# Patient Record
Sex: Male | Born: 1985 | Race: White | Hispanic: No | Marital: Single | Smoking: Never smoker
Health system: Southern US, Community
[De-identification: ages and names within clinical notes are randomized; demographics above are authoritative.]

## PROBLEM LIST (undated history)

## (undated) DIAGNOSIS — Z8489 Family history of other specified conditions: Secondary | ICD-10-CM

## (undated) DIAGNOSIS — I10 Essential (primary) hypertension: Secondary | ICD-10-CM

## (undated) DIAGNOSIS — F32A Depression, unspecified: Secondary | ICD-10-CM

## (undated) DIAGNOSIS — Z87442 Personal history of urinary calculi: Secondary | ICD-10-CM

## (undated) HISTORY — PX: KNEE SURGERY: SHX244

---

## 2019-11-04 ENCOUNTER — Emergency Department
Admission: EM | Admit: 2019-11-04 | Discharge: 2019-11-04 | Disposition: A | Discharge status: Home / Self Care | Payer: Managed Care, Other (non HMO) | Attending: Emergency Medicine | Admitting: Emergency Medicine

## 2019-11-04 ENCOUNTER — Emergency Department: Payer: Managed Care, Other (non HMO)

## 2019-11-04 ENCOUNTER — Other Ambulatory Visit: Payer: Self-pay

## 2019-11-04 DIAGNOSIS — N132 Hydronephrosis with renal and ureteral calculous obstruction: Secondary | ICD-10-CM | POA: Diagnosis not present

## 2019-11-04 DIAGNOSIS — R109 Unspecified abdominal pain: Secondary | ICD-10-CM | POA: Diagnosis present

## 2019-11-04 DIAGNOSIS — I10 Essential (primary) hypertension: Secondary | ICD-10-CM | POA: Diagnosis not present

## 2019-11-04 DIAGNOSIS — N200 Calculus of kidney: Secondary | ICD-10-CM

## 2019-11-04 HISTORY — DX: Essential (primary) hypertension: I10

## 2019-11-04 LAB — CBC
HCT: 39.8 % (ref 39.0–52.0)
Hemoglobin: 14.4 g/dL (ref 13.0–17.0)
MCH: 31.8 pg (ref 26.0–34.0)
MCHC: 36.2 g/dL — ABNORMAL HIGH (ref 30.0–36.0)
MCV: 87.9 fL (ref 80.0–100.0)
Platelets: 306 10*3/uL (ref 150–400)
RBC: 4.53 MIL/uL (ref 4.22–5.81)
RDW: 11.6 % (ref 11.5–15.5)
WBC: 15.7 10*3/uL — ABNORMAL HIGH (ref 4.0–10.5)
nRBC: 0 % (ref 0.0–0.2)

## 2019-11-04 LAB — URINALYSIS, COMPLETE (UACMP) WITH MICROSCOPIC
Bacteria, UA: NONE SEEN
Bilirubin Urine: NEGATIVE
Glucose, UA: NEGATIVE mg/dL
Ketones, ur: 5 mg/dL — AB
Leukocytes,Ua: NEGATIVE
Nitrite: NEGATIVE
Protein, ur: 100 mg/dL — AB
RBC / HPF: >50 RBC/hpf — ABNORMAL HIGH (ref 0–5)
Specific Gravity, Urine: 1.025 (ref 1.005–1.030)
pH: 6 (ref 5.0–8.0)

## 2019-11-04 LAB — COMPREHENSIVE METABOLIC PANEL
ALT: 45 U/L — ABNORMAL HIGH (ref 0–44)
AST: 33 U/L (ref 15–41)
Albumin: 5 g/dL (ref 3.5–5.0)
Alkaline Phosphatase: 53 U/L (ref 38–126)
Anion gap: 14 (ref 5–15)
BUN: 25 mg/dL — ABNORMAL HIGH (ref 6–20)
CO2: 21 mmol/L — ABNORMAL LOW (ref 22–32)
Calcium: 9.6 mg/dL (ref 8.9–10.3)
Chloride: 102 mmol/L (ref 98–111)
Creatinine, Ser: 1.52 mg/dL — ABNORMAL HIGH (ref 0.61–1.24)
GFR calc Af Amer: >60 mL/min (ref >60)
GFR calc non Af Amer: 58 mL/min — ABNORMAL LOW (ref >60)
Glucose, Bld: 135 mg/dL — ABNORMAL HIGH (ref 70–99)
Potassium: 4.3 mmol/L (ref 3.5–5.1)
Sodium: 137 mmol/L (ref 135–145)
Total Bilirubin: 0.8 mg/dL (ref 0.3–1.2)
Total Protein: 8.3 g/dL — ABNORMAL HIGH (ref 6.5–8.1)

## 2019-11-04 LAB — LIPASE, BLOOD: Lipase: 42 U/L (ref 11–51)

## 2019-11-04 MED ORDER — KETOROLAC TROMETHAMINE 30 MG/ML IJ SOLN
INTRAMUSCULAR | Status: AC
  Administered 2019-11-04: 30 mg
  Filled 2019-11-04: qty 1

## 2019-11-04 MED ORDER — KETOROLAC TROMETHAMINE 30 MG/ML IJ SOLN
15.0000 mg | INTRAMUSCULAR | Status: AC
  Administered 2019-11-04: 15 mg via INTRAVENOUS
  Filled 2019-11-04: qty 1

## 2019-11-04 MED ORDER — OXYCODONE-ACETAMINOPHEN 5-325 MG PO TABS: 1.0000 | ORAL_TABLET | Freq: Four times a day (QID) | ORAL | 0 refills | Status: DC | PRN

## 2019-11-04 MED ORDER — MORPHINE SULFATE (PF) 4 MG/ML IV SOLN: 4.0000 mg | Freq: Once | INTRAVENOUS | Status: DC

## 2019-11-04 MED ORDER — TAMSULOSIN HCL 0.4 MG PO CAPS: 0.4000 mg | ORAL_CAPSULE | Freq: Every day | ORAL | 0 refills | Status: DC

## 2019-11-04 MED ORDER — ONDANSETRON HCL 4 MG/2ML IJ SOLN: 4.0000 mg | Freq: Once | INTRAMUSCULAR | Status: AC

## 2019-11-04 MED ORDER — ONDANSETRON HCL 4 MG/2ML IJ SOLN
INTRAMUSCULAR | Status: AC
  Administered 2019-11-04: 4 mg via INTRAVENOUS
  Filled 2019-11-04: qty 2

## 2019-11-04 MED ORDER — ONDANSETRON 4 MG PO TBDP: 4.0000 mg | ORAL_TABLET | Freq: Three times a day (TID) | ORAL | 0 refills | Status: DC | PRN

## 2019-11-04 MED ORDER — CEPHALEXIN 500 MG PO CAPS: 500.0000 mg | ORAL_CAPSULE | Freq: Two times a day (BID) | ORAL | 0 refills | Status: AC

## 2019-11-04 MED ORDER — NAPROXEN 500 MG PO TABS: 500.0000 mg | ORAL_TABLET | Freq: Two times a day (BID) | ORAL | 0 refills | Status: DC

## 2019-11-04 NOTE — ED Triage Notes (Signed)
Pt to the er for severe right flank and abd pain. Pt is diaphoretic and vomiting.

## 2019-11-04 NOTE — ED Provider Notes (Signed)
[facility] Emergency Department Provider Note ____________________________________________   First MD Initiated Contact with Patient 11/04/19 1712     (approximate)  I have reviewed the triage vital signs and the nursing notes.   HISTORY  Chief Complaint Flank Pain  HPI Brandon Wyatt is a 34 y.o. male with history of hypertension presents to the emergency department for treatment and evaluation of right flank and right lower quadrant pain that started suddenly this morning.  He has also had vomiting and diaphoresis associated with the pain.  No previous symptoms similar to this.  No history of kidney stone.  No alleviating measures attempted prior to arrival..         Past Medical History:  Diagnosis Date  . Hypertension     There are no problems to display for this patient.   History reviewed. No pertinent surgical history.  Prior to Admission medications   Medication Sig Start Date End Date Taking? Authorizing Provider  cephALEXin (KEFLEX) 500 MG capsule Take 1 capsule (500 mg total) by mouth 2 (two) times daily for 7 days. 11/04/19 11/11/19  Cherye Gaertner, Rulon Eisenmenger B, FNP  naproxen (NAPROSYN) 500 MG tablet Take 1 tablet (500 mg total) by mouth 2 (two) times daily with a meal. 11/04/19   Kobe Ofallon B, FNP  ondansetron (ZOFRAN-ODT) 4 MG disintegrating tablet Take 1 tablet (4 mg total) by mouth every 8 (eight) hours as needed for nausea or vomiting. 11/04/19   Sharron Simpson B, FNP  oxyCODONE-acetaminophen (PERCOCET) 5-325 MG tablet Take 1 tablet by mouth every 6 (six) hours as needed. 11/04/19 11/03/20  Kaysia Willard, Rulon Eisenmenger B, FNP  tamsulosin (FLOMAX) 0.4 MG CAPS capsule Take 1 capsule (0.4 mg total) by mouth daily. 11/04/19   Chinita Pester, FNP    Allergies Patient has no allergy information on record.  No family history on file.  Social History Social History   Tobacco Use  . Smoking status: Never Smoker  . Smokeless tobacco: Never Used  Substance  Use Topics  . Alcohol use: Yes  . Drug use: Yes    Types: Marijuana    Review of Systems  Constitutional: No fever/chills Eyes: No visual changes. ENT: No sore throat. Cardiovascular: Denies chest pain. Respiratory: Denies shortness of breath. Gastrointestinal: Positive for abdominal pain. Genitourinary: Negative for dysuria.  Negative for obvious hematuria. Musculoskeletal: Positive for right-sided back pain.   Skin: Negative for rash. Neurological: Negative for headaches, focal weakness or numbness.  ____________________________________________   PHYSICAL EXAM:  VITAL SIGNS: ED Triage Vitals [11/04/19 1446]  Enc Vitals Group     BP (!) 212/98     Pulse Rate 94     Resp 20     Temp 97.7 F (36.5 C)     Temp src      SpO2 97 %     Weight 270 lb (122.5 kg)     Height 6' (1.829 m)     Head Circumference      Peak Flow      Pain Score 7     Pain Loc      Pain Edu?      Excl. in GC?     Constitutional: Alert and oriented. Well appearing and in no acute distress. Eyes: Conjunctivae are normal. PERRL. EOMI. Head: Atraumatic. Nose: No congestion/rhinnorhea. Mouth/Throat: Mucous membranes are moist.  Oropharynx non-erythematous. Neck: No stridor.   Hematological/Lymphatic/Immunilogical: No cervical lymphadenopathy. Cardiovascular: Normal rate, regular rhythm. Grossly normal heart sounds.  Good peripheral circulation. Respiratory: Normal respiratory  effort.  No retractions. Lungs CTAB. Gastrointestinal: Soft and nontender. No distention. No abdominal bruits. No CVA tenderness. Genitourinary:  Musculoskeletal: No lower extremity tenderness nor edema.  No joint effusions. Neurologic:  Normal speech and language. No gross focal neurologic deficits are appreciated. No gait instability. Skin:  Skin is warm, dry and intact. No rash noted. Psychiatric: Mood and affect are normal. Speech and behavior are normal.  ____________________________________________   LABS (all  labs ordered are listed, but only abnormal results are displayed)  Labs Reviewed  COMPREHENSIVE METABOLIC PANEL - Abnormal; Notable for the following components:      Result Value   CO2 21 (*)    Glucose, Bld 135 (*)    BUN 25 (*)    Creatinine, Ser 1.52 (*)    Total Protein 8.3 (*)    ALT 45 (*)    GFR calc non Af Amer 58 (*)    All other components within normal limits  CBC - Abnormal; Notable for the following components:   WBC 15.7 (*)    MCHC 36.2 (*)    All other components within normal limits  URINALYSIS, COMPLETE (UACMP) WITH MICROSCOPIC - Abnormal; Notable for the following components:   Color, Urine YELLOW (*)    APPearance HAZY (*)    Hgb urine dipstick LARGE (*)    Ketones, ur 5 (*)    Protein, ur 100 (*)    RBC / HPF >50 (*)    All other components within normal limits  LIPASE, BLOOD   ____________________________________________  EKG  Not indicated ____________________________________________  RADIOLOGY  ED MD interpretation:    Calculus measuring 5 mm in diameter and nearly 2 cm in length noted on the right distal ureter and partially protruding into the right UVJ.  Moderate hydronephrosis.  I, Kem Boroughs, personally viewed and evaluated these images (plain radiographs) as part of my medical decision making, as well as reviewing the written report by the radiologist.  Official radiology report(s): CT Renal Stone Study  Result Date: 11/04/2019 CLINICAL DATA:  Severe right flank and abdominal pain, diaphoretic and vomiting EXAM: CT ABDOMEN AND PELVIS WITHOUT CONTRAST TECHNIQUE: Multidetector CT imaging of the abdomen and pelvis was performed following the standard protocol without IV contrast. COMPARISON:  None. FINDINGS: Lower chest: Lung bases are clear. Normal heart size. No pericardial effusion. Hepatobiliary: No visible P[place] lesions on this unenhanced CT. Smooth liver surface contour with normal attenuation. Normal gallbladder and biliary tree  without visible calcified gallstone. Pancreas: Unremarkable. No pancreatic ductal dilatation or surrounding inflammatory changes. Spleen: Normal in size. No concerning splenic lesions. Adrenals/Urinary Tract: Normal adrenal glands. The right kidney appears asymmetrically enlarged and slightly hypoattenuating relative to the left kidney likely reflecting some edematous change with surrounding perinephric stranding and moderate hydroureteronephrosis to the level of linear hyperdensity in the right distal ureter which partially protrudes into the right ureterovesicular junction. This could reflect several stacked calculi or an elongated calculus measuring up to 5 mm in diameter though nearly 2 cm in length. Additional nonobstructing calculi present in the lower pole both kidneys. No focal concerning renal lesions. Circumferential bladder wall thickening and with perivesicular hazy stranding is slightly greater than expected for underdistention and could portend a superimposed cystitis. Stomach/Bowel: Distal esophagus, stomach and duodenal sweep are unremarkable. No small bowel wall thickening or dilatation. No evidence of obstruction. A normal appendix is visualized. No colonic dilatation or wall thickening accounting for underdistention. Vascular/Lymphatic: No significant vascular findings are present. No enlarged abdominal or pelvic  lymph nodes. Reproductive: Coarse eccentric calcification of the prostate. No concerning abnormalities of the prostate or seminal vesicles. Other: No abdominopelvic free fluid or free gas. No bowel containing hernias. 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 Additional mild multilevel discogenic changes elsewhere throughout the included thoracolumbar spine. Sclerotic/lucent lesion noted in the right femoral neck could reflect an atypical synovial cyst or geode formation. Additional sclerotic focus in the intertrochanteric region of the left femur,  likely bone island. IMPRESSION: 1. Moderate right hydroureteronephrosis to the level of linear hyperdensity in the right distal ureter which partially protrudes into the right ureterovesicular junction. This could reflect several stacked calculi or an elongated calculus measuring up to 5 mm in diameter though nearly 2 cm in length. 2. Circumferential bladder wall thickening and with perivesicular hazy stranding is slightly greater than expected for underdistention and could portend a superimposed cystitis. Correlate with urinalysis. 3. Additional nonobstructing calculi in the lower pole both kidneys. 4. 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 5. Benign appearing sclerotic/lucent lesion in the right femoral neck could reflect an atypical synovial cyst or geode formation given absence of aggressive features. Electronically Signed   By: Kreg ShropshirePrice  DeHay M.D.   On: 11/04/2019 15:40    ____________________________________________   PROCEDURES  Procedure(s) performed (including Critical Care):  Procedures  ____________________________________________   INITIAL IMPRESSION / ASSESSMENT AND PLAN     34 year old male presenting to the emergency department for acute onset of right flank and right lower quadrant pain.  While awaiting ER room assignment, labs were drawn and urinalysis was collected.  He was also given pain medication which has provided significant relief.  Pain is currently a 2/10.  Plan will be to review the CT results and urinalysis/lab studies.  DIFFERENTIAL DIAGNOSIS  Kidney stone, pyelonephritis  ED COURSE  Urinalysis shows a large amount hemoglobin, greater than 50 red blood cells and large amount of protein but no white blood cells or bacteria.  CBC shows him leukocytosis of 15.7.  CMP shows a BUN and creatinine of 25 and 1.52 with an ALT of 45.  Patient's pain is well controlled.  He is tolerating food and fluids.  Results of the CT were discussed.   Because his pain is very well controlled and he is now able to tolerate fluids he will be discharged to follow-up outpatient with urology.  He was advised that this is very important and is to call tomorrow morning to schedule the appointment.  He will be prescribed Keflex due to the leukocytosis, Flomax, Naprosyn, Zofran, and Percocet.  Strict ER return precautions were discussed.  If his pain is not managed by medication at home he is aware that he will need to return to the emergency department.  Patient verbalizes understanding and agrees to the plan.    ___________________________________________   FINAL CLINICAL IMPRESSION(S) / ED DIAGNOSES  Final diagnoses:  Kidney stone     ED Discharge Orders         Ordered    oxyCODONE-acetaminophen (PERCOCET) 5-325 MG tablet  Every 6 hours PRN        11/04/19 1734    naproxen (NAPROSYN) 500 MG tablet  2 times daily with meals        11/04/19 1734    cephALEXin (KEFLEX) 500 MG capsule  2 times daily        11/04/19 1734    tamsulosin (FLOMAX) 0.4 MG CAPS capsule  Daily  11/04/19 1734    ondansetron (ZOFRAN-ODT) 4 MG disintegrating tablet  Every 8 hours PRN        11/04/19 1736           Brandon Wyatt was evaluated in Emergency Department on 11/04/2019 for the symptoms described in the history of present illness. He was evaluated in the context of the global COVID-19 pandemic, which necessitated consideration that the patient might be at risk for infection with the SARS-CoV-2 virus that causes COVID-19. Institutional protocols and algorithms that pertain to the evaluation of patients at risk for COVID-19 are in a state of rapid change based on information released by regulatory bodies including the CDC and federal and state organizations. These policies and algorithms were followed during the patient's care in the ED.   Note:  This document was prepared using Dragon voice recognition software and may include unintentional dictation  errors.   Chinita Pester, FNP 11/04/19 2033    Jene Every, MD 11/04/19 2041

## 2019-11-04 NOTE — Discharge Instructions (Signed)
Please take all medications as prescribed.  Call the urology office tomorrow to schedule an appointment.  If the pain returns and medication is not sufficient, return to the emergency department.

## 2019-11-13 ENCOUNTER — Other Ambulatory Visit: Payer: Self-pay

## 2019-11-13 ENCOUNTER — Other Ambulatory Visit
Admission: RE | Admit: 2019-11-13 | Discharge: 2019-11-13 | Disposition: A | Discharge status: Home / Self Care | Payer: Managed Care, Other (non HMO) | Attending: Urology | Admitting: Urology

## 2019-11-13 ENCOUNTER — Encounter: Payer: Self-pay | Admitting: Urology

## 2019-11-13 ENCOUNTER — Ambulatory Visit: Payer: Managed Care, Other (non HMO) | Admitting: Urology

## 2019-11-13 ENCOUNTER — Other Ambulatory Visit: Payer: Self-pay | Admitting: *Deleted

## 2019-11-13 VITALS — BP 177/100 | HR 125 | Ht 72.0 in | Wt 275.0 lb

## 2019-11-13 DIAGNOSIS — N201 Calculus of ureter: Secondary | ICD-10-CM

## 2019-11-13 DIAGNOSIS — N200 Calculus of kidney: Secondary | ICD-10-CM | POA: Diagnosis present

## 2019-11-13 LAB — URINALYSIS, COMPLETE (UACMP) WITH MICROSCOPIC
Bilirubin Urine: NEGATIVE
Glucose, UA: NEGATIVE mg/dL
Ketones, ur: NEGATIVE mg/dL
Leukocytes,Ua: NEGATIVE
Nitrite: NEGATIVE
Protein, ur: NEGATIVE mg/dL
Specific Gravity, Urine: 1.02 (ref 1.005–1.030)
pH: 6 (ref 5.0–8.0)

## 2019-11-13 NOTE — H&P (View-Only) (Signed)
   11/13/19 1:07 PM   Brandon Wyatt [birth year] [id number]  CC: right ureteral stone  HPI: I saw Brandon Wyatt in urology clinic for evaluation of a large right ureteral stone.  He is a 34 year old male with a history of hypertension who presented to the ED last week with acute onset severe right renal colic and nausea/vomiting and CT showed a 5 mm x 2 cm right distal ureteral stone with moderate hydronephrosis, as well as a 1 cm right renal stone.  He denies any prior stone episodes.  Urinalysis showed microscopic hematuria but was otherwise benign, and he was discharged on medical expulsive therapy.  He reports he is occasionally had some twinges of right-sided groin pain, but nothing nearly as severe as when he was in the ED.  He also has a long history of bladder symptoms of urgency and frequency with urination.  He denies any gross hematuria, fevers, or chills.  Urinalysis today with persistent microscopic hematuria with 6-10 RBCs   Social History:  reports that he has never smoked. He has never used smokeless tobacco. He reports current alcohol use. He reports current drug use. Drug: Marijuana.  Physical Exam: BP (!) 177/100   Pulse (!) 125   Ht 6' (1.829 m)   Wt 275 lb (124.7 kg)   BMI 37.30 kg/m    Constitutional:  Alert and oriented, No acute distress. Cardiovascular: No clubbing, cyanosis, or edema. Respiratory: Normal respiratory effort, no increased work of breathing. GI: Abdomen is soft, nontender, nondistended, no abdominal masses GU: No CVA tenderness  Laboratory Data: Reviewed, see HPI  Pertinent Imaging: I have personally reviewed the CT stone protocol dated 11/04/2019.  Faint appearing but large right distal ureteral calculus projecting into the lumen of the bladder with upstream hydronephrosis, as well as a 1 cm right renal stone  Assessment & Plan:   In summary, he is a 34 year old male with a nearly 2 cm right distal ureteral stone projecting into the lumen  of the bladder and persistent microscopic hematuria, his pain is currently well controlled.  We discussed various treatment options for urolithiasis including observation with or without medical expulsive therapy, shockwave lithotripsy (SWL), ureteroscopy and laser lithotripsy with stent placement, and percutaneous nephrolithotomy.  We discussed that management is based on stone size, location, density, patient co-morbidities, and patient preference.   Stones <37mm in size have a >80% spontaneous passage rate. Data surrounding the use of tamsulosin for medical expulsive therapy is controversial, but meta analyses suggests it is most efficacious for distal stones between 5-24mm in size. Possible side effects include dizziness/lightheadedness, and retrograde ejaculation.  SWL has a lower stone free rate in a single procedure, but also a lower complication rate compared to ureteroscopy and avoids a stent and associated stent related symptoms. Possible complications include renal hematoma, steinstrasse, and need for additional treatment.  Ureteroscopy with laser lithotripsy and stent placement has a higher stone free rate than SWL in a single procedure, however increased complication rate including possible infection, ureteral injury, bleeding, and stent related morbidity. Common stent related symptoms include dysuria, urgency/frequency, and flank pain.  With his large stone size, I recommended pursuing definitive management with ureteroscopy and laser lithotripsy, as well as simultaneous management of his large renal stone, and he is in agreement.  Return precautions were discussed at length  Schedule right ureteroscopy, laser lithotripsy, stent placement  Legrand Rams, MD 11/13/2019  [facility] [address] [place], [place] [address] [phone]

## 2019-11-13 NOTE — Patient Instructions (Addendum)
Laser Therapy for Kidney Stones Laser therapy for kidney stones is a procedure to break up small, hard mineral deposits that form in the kidney (kidney stones). The procedure is done using a device that produces a focused beam of light (laser). The laser breaks up kidney stones into pieces that are small enough to be passed out of the body through urination or removed from the body during the procedure. You may need laser therapy if you have kidney stones that are painful or block your urinary tract. This procedure is done by inserting a tube (ureteroscope) into your kidney through the urethral opening. The urethra is the part of the body that drains urine from the bladder. In women, the urethra opens above the vaginal opening. In men, the urethra opens at the tip of the penis. The ureteroscope is inserted through the urethra, and surgical instruments are moved through the bladder and the muscular tube that connects the kidney to the bladder (ureter) until they reach the kidney. Tell a health care provider about:  Any allergies you have.  All medicines you are taking, including vitamins, herbs, eye drops, creams, and over-the-counter medicines.  Any problems you or family members have had with anesthetic medicines.  Any blood disorders you have.  Any surgeries you have had.  Any medical conditions you have.  Whether you are pregnant or may be pregnant. What are the risks? Generally, this is a safe procedure. However, problems may occur, including:  Infection.  Bleeding.  Allergic reactions to medicines.  Damage to the urethra, bladder, or ureter.  Urinary tract infection (UTI).  Narrowing of the urethra (urethral stricture).  Difficulty passing urine.  Blockage of the kidney caused by a fragment of kidney stone. What happens before the procedure? Medicines  Ask your health care provider about: ? Changing or stopping your regular medicines. This is especially important if you  are taking diabetes medicines or blood thinners. ? Taking medicines such as aspirin and ibuprofen. These medicines can thin your blood. Do not take these medicines unless your health care provider tells you to take them. ? Taking over-the-counter medicines, vitamins, herbs, and supplements. Eating and drinking Follow instructions from your health care provider about eating and drinking, which may include:  8 hours before the procedure - stop eating heavy meals or foods, such as meat, fried foods, or fatty foods.  6 hours before the procedure - stop eating light meals or foods, such as toast or cereal.  6 hours before the procedure - stop drinking milk or drinks that contain milk.  2 hours before the procedure - stop drinking clear liquids. Staying hydrated Follow instructions from your health care provider about hydration, which may include:  Up to 2 hours before the procedure - you may continue to drink clear liquids, such as water, clear fruit juice, black coffee, and plain tea.  General instructions  You may have a physical exam before the procedure. You may also have tests, such as imaging tests and blood or urine tests.  If your ureter is too narrow, your health care provider may place a soft, flexible tube (stent) inside of it. The stent may be placed days or weeks before your laser therapy procedure.  Plan to have someone take you home from the hospital or clinic.  If you will be going home right after the procedure, plan to have someone stay with you for 24 hours.  Do not use any products that contain nicotine or tobacco for at least 4   weeks before the procedure. These products include cigarettes, e-cigarettes, and chewing tobacco. If you need help quitting, ask your health care provider.  Ask your health care provider: ? How your surgical site will be marked or identified. ? What steps will be taken to help prevent infection. These may include:  Removing hair at the surgery  site.  Washing skin with a germ-killing soap.  Taking antibiotic medicine. What happens during the procedure?   An IV will be inserted into one of your veins.  You will be given one or more of the following: ? A medicine to help you relax (sedative). ? A medicine to numb the area (local anesthetic). ? A medicine to make you fall asleep (general anesthetic).  A ureteroscope will be inserted into your urethra. The ureteroscope will send images to a video screen in the operating room to guide your surgeon to the area of your kidney that will be treated.  A small, flexible tube will be threaded through the ureteroscope and into your bladder and ureter, up to your kidney.  The laser device will be inserted into your kidney through the tube. Your surgeon will pulse the laser on and off to break up kidney stones.  A surgical instrument that has a tiny wire basket may be inserted through the tube into your kidney to remove the pieces of broken kidney stone. The procedure may vary among health care providers and hospitals. What happens after the procedure?  Your blood pressure, heart rate, breathing rate, and blood oxygen level will be monitored until you leave the hospital or clinic.  You will be given pain medicine as needed.  You may continue to receive antibiotics.  You may have a stent temporarily placed in your ureter.  Do not drive for 24 hours if you were given a sedative during your procedure.  You may be given a strainer to collect any stone fragments that you pass in your urine. Your health care provider may have these tested. Summary  Laser therapy for kidney stones is a procedure to break up kidney stones into pieces that are small enough to be passed out of the body through urination or removed during the procedure.  Follow instructions from your health care provider about eating and drinking before the procedure.  During the procedure, the ureteroscope will send images  to a video screen to guide your surgeon to the area of your kidney that will be treated.  Do not drive for 24 hours if you were given a sedative during your procedure. This information is not intended to replace advice given to you by your health care provider. Make sure you discuss any questions you have with your health care provider. Document Revised: 10/20/2017 Document Reviewed: 10/20/2017 Elsevier Patient Education  2020 [employer]   Ureteral Stent Implantation  Ureteral stent implantation is a procedure to insert (implant) a flexible, soft, plastic tube (stent) into a ureter. Ureters are the tube-like parts of the body that drain urine from the kidneys. The stent supports the ureter while it heals and helps to drain urine. You may have a ureteral stent implanted after having a procedure to remove a blockage from the ureter (ureterolysis or pyeloplasty). You may also have a stent implanted to open the flow of urine when you have a blockage caused by a kidney stone, tumor, blood clot, or infection. You have two ureters, one on each side of the body. The ureters connect the kidneys to the organ that holds urine   until it passes out of the body (bladder). The stent is placed so that one end is in the kidney, and one end is in the bladder. The stent is usually taken out after your ureter has healed. Depending on your condition, you may have a stent for just a few weeks, or you may have a long-term stent that will need to be replaced every few months. Tell a health care provider about:  Any allergies you have.  All medicines you are taking, including vitamins, herbs, eye drops, creams, and over-the-counter medicines.  Any problems you or family members have had with anesthetic medicines.  Any blood disorders you have.  Any surgeries you have had.  Any medical conditions you have.  Whether you are pregnant or may be pregnant. What are the risks? Generally, this is a safe procedure.  However, problems may occur, including:  Infection.  Bleeding.  Allergic reactions to medicines.  Damage to other structures or organs. Tearing (perforation) of the ureter is possible.  Movement of the stent away from where it is placed during surgery (migration). What happens before the procedure? Medicines Ask your health care provider about:  Changing or stopping your regular medicines. This is especially important if you are taking diabetes medicines or blood thinners.  Taking medicines such as aspirin and ibuprofen. These medicines can thin your blood. Do not take these medicines unless your health care provider tells you to take them.  Taking over-the-counter medicines, vitamins, herbs, and supplements. Eating and drinking Follow instructions from your health care provider about eating and drinking, which may include:  8 hours before the procedure - stop eating heavy meals or foods, such as meat, fried foods, or fatty foods.  6 hours before the procedure - stop eating light meals or foods, such as toast or cereal.  6 hours before the procedure - stop drinking milk or drinks that contain milk.  2 hours before the procedure - stop drinking clear liquids. Staying hydrated Follow instructions from your health care provider about hydration, which may include:  Up to 2 hours before the procedure - you may continue to drink clear liquids, such as water, clear fruit juice, black coffee, and plain tea. General instructions  Do not drink alcohol.  Do not use any products that contain nicotine or tobacco for at least 4 weeks before the procedure. These products include cigarettes, e-cigarettes, and chewing tobacco. If you need help quitting, ask your health care provider.  You may have an exam or testing, such as imaging or blood tests.  Ask your health care provider what steps will be taken to help prevent infection. These may include: ? Removing hair at the surgery  site. ? Washing skin with a germ-killing soap. ? Taking antibiotic medicine.  Plan to have someone take you home from the hospital or clinic.  If you will be going home right after the procedure, plan to have someone with you for 24 hours. What happens during the procedure?  An IV will be inserted into one of your veins.  You may be given a medicine to help you relax (sedative).  You may be given a medicine to make you fall asleep (general anesthetic).  A thin, tube-shaped instrument with a light and tiny camera at the end (cystoscope) will be inserted into your urethra. The urethra is the tube that drains urine from the bladder out of the body. In men, the urethra opens at the end of the penis. In women, the urethra opens   in front of the vaginal opening.  The cystoscope will be passed into your bladder.  A thin wire (guide wire) will be passed through your bladder and into your ureter. This is used to guide the stent into your ureter.  The stent will be inserted into your ureter.  The guide wire and the cystoscope will be removed.  A flexible tube (catheter) may be inserted through your urethra so that one end is in your bladder. This helps to drain urine from your bladder. The procedure may vary among hospitals and health care providers. What happens after the procedure?  Your blood pressure, heart rate, breathing rate, and blood oxygen level will be monitored until you leave the hospital or clinic.  You may continue to receive medicine and fluids through an IV.  You may have some soreness or pain in your abdomen and urethra. Medicines will be available to help you.  You will be encouraged to get up and walk around as soon as you can.  You may have a catheter draining your urine.  You will have some blood in your urine.  Do not drive for 24 hours if you were given a sedative during your procedure. Summary  Ureteral stent implantation is a procedure to insert a flexible,  soft, plastic tube (stent) into a ureter.  You may have a stent implanted to support the ureter while it heals after a procedure or to open the flow of urine if there is a blockage.  Follow instructions from your health care provider about taking medicines and about eating and drinking before the procedure.  Depending on your condition, you may have a stent for just a few weeks, or you may have a long-term stent that will need to be replaced every few months. This information is not intended to replace advice given to you by your health care provider. Make sure you discuss any questions you have with your health care provider. Document Revised: 11/15/2017 Document Reviewed: 11/16/2017 Elsevier Patient Education  2020 [employer]   Dietary Guidelines to Help Prevent Kidney Stones Kidney stones are deposits of minerals and salts that form inside your kidneys. Your risk of developing kidney stones may be greater depending on your diet, your lifestyle, the medicines you take, and whether you have certain medical conditions. Most people can reduce their chances of developing kidney stones by following the instructions below. Depending on your overall health and the type of kidney stones you tend to develop, your dietitian may give you more specific instructions. What are tips for following this plan? Reading food labels  Choose foods with "no salt added" or "low-salt" labels. Limit your sodium intake to less than 1500 mg per day.  Choose foods with calcium for each meal and snack. Try to eat about 300 mg of calcium at each meal. Foods that contain 200-500 mg of calcium per serving include: ? 8 oz (237 ml) of milk, fortified nondairy milk, and fortified fruit juice. ? 8 oz (237 ml) of kefir, yogurt, and soy yogurt. ? 4 oz (118 ml) of tofu. ? 1 oz of cheese. ? 1 cup (300 g) of dried figs. ? 1 cup (91 g) of cooked broccoli. ? 1-3 oz can of sardines or mackerel.  Most people need 1000 to 1500  mg of calcium each day. Talk to your dietitian about how much calcium is recommended for you. Shopping  Buy plenty of fresh fruits and vegetables. Most people do not need to avoid fruits and vegetables,   even if they contain nutrients that may contribute to kidney stones.  When shopping for convenience foods, choose: ? Whole pieces of fruit. ? Premade salads with dressing on the side. ? Low-fat fruit and yogurt smoothies.  Avoid buying frozen meals or prepared deli foods.  Look for foods with live cultures, such as yogurt and kefir. Cooking  Do not add salt to food when cooking. Place a salt shaker on the table and allow each person to add his or her own salt to taste.  Use vegetable protein, such as beans, textured vegetable protein (TVP), or tofu instead of meat in pasta, casseroles, and soups. Meal planning   Eat less salt, if told by your dietitian. To do this: ? Avoid eating processed or premade food. ? Avoid eating fast food.  Eat less animal protein, including cheese, meat, poultry, or fish, if told by your dietitian. To do this: ? Limit the number of times you have meat, poultry, fish, or cheese each week. Eat a diet free of meat at least 2 days a week. ? Eat only one serving each day of meat, poultry, fish, or seafood. ? When you prepare animal protein, cut pieces into small portion sizes. For most meat and fish, one serving is about the size of one deck of cards.  Eat at least 5 servings of fresh fruits and vegetables each day. To do this: ? Keep fruits and vegetables on hand for snacks. ? Eat 1 piece of fruit or a handful of berries with breakfast. ? Have a salad and fruit at lunch. ? Have two kinds of vegetables at dinner.  Limit foods that are high in a substance called oxalate. These include: ? Spinach. ? Rhubarb. ? Beets. ? Potato chips and french fries. ? Nuts.  If you regularly take a diuretic medicine, make sure to eat at least 1-2 fruits or vegetables high  in potassium each day. These include: ? Avocado. ? Banana. ? Orange, prune, carrot, or tomato juice. ? Baked potato. ? Cabbage. ? Beans and split peas. General instructions   Drink enough fluid to keep your urine clear or pale yellow. This is the most important thing you can do.  Talk to your health care provider and dietitian about taking daily supplements. Depending on your health and the cause of your kidney stones, you may be advised: ? Not to take supplements with vitamin C. ? To take a calcium supplement. ? To take a daily probiotic supplement. ? To take other supplements such as magnesium, fish oil, or vitamin B6.  Take all medicines and supplements as told by your health care provider.  Limit alcohol intake to no more than 1 drink a day for nonpregnant women and 2 drinks a day for men. One drink equals 12 oz of beer, 5 oz of wine, or 1 oz of hard liquor.  Lose weight if told by your health care provider. Work with your dietitian to find strategies and an eating plan that works best for you. What foods are not recommended? Limit your intake of the following foods, or as told by your dietitian. Talk to your dietitian about specific foods you should avoid based on the type of kidney stones and your overall health. Grains Breads. Bagels. Rolls. Baked goods. Salted crackers. Cereal. Pasta. Vegetables Spinach. Rhubarb. Beets. Canned vegetables. Pickles. Olives. Meats and other protein foods Nuts. Nut butters. Large portions of meat, poultry, or fish. Salted or cured meats. Deli meats. Hot dogs. Sausages. Dairy Cheese. Beverages Regular   soft drinks. Regular vegetable juice. Seasonings and other foods Seasoning blends with salt. Salad dressings. Canned soups. Soy sauce. Ketchup. Barbecue sauce. Canned pasta sauce. Casseroles. Pizza. Lasagna. Frozen meals. Potato chips. French fries. Summary  You can reduce your risk of kidney stones by making changes to your diet.  The most  important thing you can do is drink enough fluid. You should drink enough fluid to keep your urine clear or pale yellow.  Ask your health care provider or dietitian how much protein from animal sources you should eat each day, and also how much salt and calcium you should have each day. This information is not intended to replace advice given to you by your health care provider. Make sure you discuss any questions you have with your health care provider. Document Revised: 05/31/2018 Document Reviewed: 01/20/2016 Elsevier Patient Education  2020 [employer] Inc[employer]  

## 2019-11-13 NOTE — Progress Notes (Signed)
11/13/19 1:07 PM   Brandon Wyatt 1985-12-28 [id number]  CC: right ureteral stone  HPI: I saw Brandon Wyatt in urology clinic for evaluation of a large right ureteral stone.  He is a 34 year old male with a history of hypertension who presented to the ED last week with acute onset severe right renal colic and nausea/vomiting and CT showed a 5 mm x 2 cm right distal ureteral stone with moderate hydronephrosis, as well as a 1 cm right renal stone.  He denies any prior stone episodes.  Urinalysis showed microscopic hematuria but was otherwise benign, and he was discharged on medical expulsive therapy.  He reports he is occasionally had some twinges of right-sided groin pain, but nothing nearly as severe as when he was in the ED.  He also has a long history of bladder symptoms of urgency and frequency with urination.  He denies any gross hematuria, fevers, or chills.  Urinalysis today with persistent microscopic hematuria with 6-10 RBCs   Social History:  reports that he has never smoked. He has never used smokeless tobacco. He reports current alcohol use. He reports current drug use. Drug: Marijuana.  Physical Exam: BP (!) 177/100    Pulse (!) 125    Ht 6' (1.829 m)    Wt 275 lb (124.7 kg)    BMI 37.30 kg/m    Constitutional:  Alert and oriented, No acute distress. Cardiovascular: No clubbing, cyanosis, or edema. Respiratory: Normal respiratory effort, no increased work of breathing. GI: Abdomen is soft, nontender, nondistended, no abdominal masses GU: No CVA tenderness  Laboratory Data: Reviewed, see HPI  Pertinent Imaging: I have personally reviewed the CT stone protocol dated 11/04/2019.  Faint appearing but large right distal ureteral calculus projecting into the lumen of the bladder with upstream hydronephrosis, as well as a 1 cm right renal stone  Assessment & Plan:   In summary, he is a 34 year old male with a nearly 2 cm right distal ureteral stone projecting into the lumen  of the bladder and persistent microscopic hematuria, his pain is currently well controlled.  We discussed various treatment options for urolithiasis including observation with or without medical expulsive therapy, shockwave lithotripsy (SWL), ureteroscopy and laser lithotripsy with stent placement, and percutaneous nephrolithotomy.  We discussed that management is based on stone size, location, density, patient co-morbidities, and patient preference.   Stones <40mm in size have a >80% spontaneous passage rate. Data surrounding the use of tamsulosin for medical expulsive therapy is controversial, but meta analyses suggests it is most efficacious for distal stones between 5-62mm in size. Possible side effects include dizziness/lightheadedness, and retrograde ejaculation.  SWL has a lower stone free rate in a single procedure, but also a lower complication rate compared to ureteroscopy and avoids a stent and associated stent related symptoms. Possible complications include renal hematoma, steinstrasse, and need for additional treatment.  Ureteroscopy with laser lithotripsy and stent placement has a higher stone free rate than SWL in a single procedure, however increased complication rate including possible infection, ureteral injury, bleeding, and stent related morbidity. Common stent related symptoms include dysuria, urgency/frequency, and flank pain.  With his large stone size, I recommended pursuing definitive management with ureteroscopy and laser lithotripsy, as well as simultaneous management of his large renal stone, and he is in agreement.  Return precautions were discussed at length  Schedule right ureteroscopy, laser lithotripsy, stent placement  Legrand Rams, MD 11/13/2019  [facility] [address], [address] [place], [place] [address] [phone]

## 2019-11-15 ENCOUNTER — Other Ambulatory Visit: Payer: Self-pay | Admitting: Urology

## 2019-11-15 DIAGNOSIS — N201 Calculus of ureter: Secondary | ICD-10-CM

## 2019-11-16 ENCOUNTER — Ambulatory Visit (INDEPENDENT_AMBULATORY_CARE_PROVIDER_SITE_OTHER): Payer: Managed Care, Other (non HMO) | Admitting: Urology

## 2019-11-16 ENCOUNTER — Other Ambulatory Visit: Payer: Self-pay

## 2019-11-16 ENCOUNTER — Encounter
Admission: RE | Admit: 2019-11-16 | Discharge: 2019-11-16 | Disposition: A | Discharge status: Home / Self Care | Payer: Managed Care, Other (non HMO) | Source: Ambulatory Visit | Attending: Urology | Admitting: Urology

## 2019-11-16 ENCOUNTER — Other Ambulatory Visit
Admission: RE | Admit: 2019-11-16 | Discharge: 2019-11-16 | Disposition: A | Discharge status: Home / Self Care | Payer: Managed Care, Other (non HMO) | Source: Ambulatory Visit | Attending: Urology | Admitting: Urology

## 2019-11-16 ENCOUNTER — Telehealth: Payer: Self-pay

## 2019-11-16 ENCOUNTER — Encounter: Payer: Self-pay | Admitting: Urology

## 2019-11-16 VITALS — BP 163/106 | HR 108 | Temp 97.7°F

## 2019-11-16 DIAGNOSIS — Z20822 Contact with and (suspected) exposure to covid-19: Secondary | ICD-10-CM | POA: Insufficient documentation

## 2019-11-16 DIAGNOSIS — Z01812 Encounter for preprocedural laboratory examination: Secondary | ICD-10-CM | POA: Diagnosis present

## 2019-11-16 DIAGNOSIS — N200 Calculus of kidney: Secondary | ICD-10-CM | POA: Diagnosis not present

## 2019-11-16 LAB — SARS CORONAVIRUS 2 (TAT 6-24 HRS): SARS Coronavirus 2: NEGATIVE

## 2019-11-16 LAB — URINALYSIS, COMPLETE
Bilirubin, UA: NEGATIVE
Glucose, UA: NEGATIVE
Ketones, UA: NEGATIVE
Leukocytes,UA: NEGATIVE
Nitrite, UA: NEGATIVE
Protein,UA: NEGATIVE
Specific Gravity, UA: <1.005 — ABNORMAL LOW (ref 1.005–1.030)
Urobilinogen, Ur: 0.2 mg/dL (ref 0.2–1.0)
pH, UA: 5.5 (ref 5.0–7.5)

## 2019-11-16 LAB — MICROSCOPIC EXAMINATION

## 2019-11-16 MED ORDER — KETOROLAC TROMETHAMINE 60 MG/2ML IM SOLN
60.0000 mg | Freq: Once | INTRAMUSCULAR | Status: AC
  Administered 2019-11-16: 60 mg via INTRAMUSCULAR

## 2019-11-16 NOTE — Telephone Encounter (Signed)
Incoming call from pt who states he is in a lot of pain and was told by the provider to call if he began having worsening symptoms. Pt states that he has had episodes of vomiting x 3 hours this morning, he notes difficulty voiding with a weaken stream, dark brown urine, and diffuse abdominal pain. Pain currently 9 out of 10. Patient denies fever, or chills. The patient has drank minimal water this morning, and attempted to eat oatmeal but began vomiting. Spoke with provider in office who recommends patient come into office immediately for assessment. Patient gave verbal understanding. Patient added to schedule.

## 2019-11-16 NOTE — Patient Instructions (Signed)
Your procedure is scheduled on: Monday September  Report to Day Surgery inside [facility] 2nd floor. To find out your arrival time please call [phone] between 1PM - 3PM on Friday November 16, 2019.  Remember: Instructions that are not followed completely may result in serious medical risk,  up to and including death, or upon the discretion of your surgeon and anesthesiologist your  surgery may need to be rescheduled.     _X__ 1. Do not eat food after midnight the night before your procedure.                 No chewing gum or hard candies. You may drink clear liquids up to 2 hours                 before you are scheduled to arrive for your surgery- DO not drink clear                 liquids within 2 hours of the start of your surgery.                 Clear Liquids include:  water, apple juice without pulp, clear Gatorade, G2 or                  Gatorade Zero (avoid Red/Purple/Blue), Black Coffee or Tea (Do not add                 anything to coffee or tea).  __X__2.  On the morning of surgery brush your teeth with toothpaste and water, you                may rinse your mouth with mouthwash if you wish.  Do not swallow any toothpaste of mouthwash.     _X__ 3.  No Alcohol for 24 hours before or after surgery.   _X__ 4.  Do Not Smoke or use e-cigarettes For 24 Hours Prior to Your Surgery.                 Do not use any chewable tobacco products for at least 6 hours prior to                 Surgery.  _X__  5.  Do not use any recreational drugs (marijuana, cocaine, heroin, ecstasy, MDMA or other)                For at least one week prior to your surgery.  Combination of these drugs with anesthesia                May have life threatening results.  __x__  7.  Notify your doctor if there is any change in your medical condition      (cold, fever, infections).     Do not wear jewelry, make-up, hairpins, clips or nail polish. Do not wear lotions, powders, or  perfumes. You may wear deodorant. Do not shave 48 hours prior to surgery. Men may shave face and neck. Do not bring valuables to the hospital.    [facility] is not responsible for any belongings or valuables.  Contacts, dentures or bridgework may not be worn into surgery. Leave your suitcase in the car. After surgery it may be brought to your room. For patients admitted to the hospital, discharge time is determined by your treatment team.   Patients discharged the day of surgery will not be allowed to drive home.   Make arrangements for someone to be  with you for the first 24 hours of your Same Day Discharge.   __x__ Take these medicines the morning of surgery with A SIP OF WATER:    1. amLODipine (NORVASC) 5 MG  2.  tamsulosin (FLOMAX) 0.4 MG    __x__ Stop Anti-inflammatories such as Ibuprofen, Aleve, Advil, naproxen and or BC powders.    __x__ Stop supplements until after surgery.    __x__ Do not start any herbal supplements before your procedure   If you have any questions regarding your pre-procedure instructions,  Please call Pre-admit Testing at [phone].

## 2019-11-16 NOTE — Progress Notes (Signed)
11/16/2019 2:03 PM   Brandon Wyatt 25-Aug-1985 [id number]  Referring provider: Jaclyn Shaggy, MD [address]   [place],  [place] [address]  Chief Complaint  Patient presents with  . Nephrolithiasis    HPI: Brandon Wyatt is a 34 y.o. male with nephrolithiasis who presents today for an urgent appointment.    Patient was seen and evaluated by Dr. Richardo Hanks on November 13, 2019 for acute onset severe right renal colic and nausea/vomiting and CT showed a 5 mm x 2 cm right distal ureteral stone with moderate hydronephrosis, as well as a 1 cm right renal stone.  He has been scheduled for URS on 11/22/2019, but he contact the office this morning complaining of vomiting, intractable pain and inability to urinate.  Patient states that this morning, he had a very difficult time urinating associated with dark urine, some vomiting and suprapubic cramping pain.  He was very concerned regarding the symptoms and contacted the office for further advice for which we told him to come in for a urgent appointment.   He states the cramping pain is as intense as it was the day he sought treatment in the ED.  He has Percocet on hand, but he did not take the medication as he understood that he could only take 1 a day.  He did take his Zofran and tamsulosin this morning.  He is feeling some better at this time.  He has not passed any fragments, but he did not have a strainer on hand.    UA positive for micro heme.   PMH: Past Medical History:  Diagnosis Date  . Depression   . Family history of adverse reaction to anesthesia    grandfatehr was hallucinating, possible due to medication and due to age   . History of kidney stones   . Hypertension     Surgical History: Past Surgical History:  Procedure Laterality Date  . CYSTOSCOPY/URETEROSCOPY/HOLMIUM LASER/STENT PLACEMENT Right 11/19/2019   Procedure: CYSTOSCOPY/URETEROSCOPY/HOLMIUM LASER/STENT PLACEMENT;  Surgeon: Sondra Come, MD;   Location: [facility] ORS;  Service: Urology;  Laterality: Right;  . KNEE SURGERY Left     Home Medications:  Allergies as of 11/16/2019   No Known Allergies     Medication List       Accurate as of November 16, 2019 11:59 PM. If you have any questions, ask your nurse or doctor.        amLODipine 5 MG tablet Commonly known as: NORVASC Take 5 mg by mouth daily.   hydrochlorothiazide 12.5 MG capsule Commonly known as: MICROZIDE Take 12.5 mg by mouth every morning.   losartan 100 MG tablet Commonly known as: COZAAR Take 100 mg by mouth daily.   naproxen 500 MG tablet Commonly known as: Naprosyn Take 1 tablet (500 mg total) by mouth 2 (two) times daily with a meal.   ondansetron 4 MG disintegrating tablet Commonly known as: ZOFRAN-ODT Take 1 tablet (4 mg total) by mouth every 8 (eight) hours as needed for nausea or vomiting.   oxybutynin 10 MG 24 hr tablet Commonly known as: Ditropan XL Take 1 tablet (10 mg total) by mouth daily as needed for up to 4 days (bladder pain/stent pain).   oxyCODONE-acetaminophen 5-325 MG tablet Commonly known as: Percocet Take 1 tablet by mouth every 6 (six) hours as needed.   oxyCODONE-acetaminophen 5-325 MG tablet Commonly known as: Percocet Take 1 tablet by mouth every 4 (four) hours as needed for up to 3 days for  severe pain.   rosuvastatin 10 MG tablet Commonly known as: CRESTOR Take 10 mg by mouth at bedtime.   tamsulosin 0.4 MG Caps capsule Commonly known as: FLOMAX Take 1 capsule (0.4 mg total) by mouth daily.       Allergies: No Known Allergies  Family History: No family history on file.  Social History:  reports that he has never smoked. He has never used smokeless tobacco. He reports current alcohol use. He reports current drug use. Drug: Marijuana.  ROS: Pertinent ROS in HPI  Physical Exam: BP (!) 163/106   Pulse (!) 108   Temp 97.7 F (36.5 C)   Constitutional:  Well nourished. Alert and oriented, No acute  distress.  Sitting uneasily in the chair HEENT: [place] AT, mask in place.  Trachea midline Cardiovascular: No clubbing, cyanosis, or edema. Respiratory: Normal respiratory effort, no increased work of breathing. Neurologic: Grossly intact, no focal deficits, moving all 4 extremities. Psychiatric: Normal mood and affect.  Laboratory Data: Lab Results  Component Value Date   WBC 15.7 (H) 11/04/2019   HGB 14.4 11/04/2019   HCT 39.8 11/04/2019   MCV 87.9 11/04/2019   PLT 306 11/04/2019    Lab Results  Component Value Date   CREATININE 1.52 (H) 11/04/2019     Lab Results  Component Value Date   AST 33 11/04/2019   Lab Results  Component Value Date   ALT 45 (H) 11/04/2019    Urinalysis Component     Latest Ref Rng & Units 11/16/2019  Specific Gravity, UA     1.005 - 1.030 <1.005 (L)  pH, UA     5.0 - 7.5 5.5  Color, UA     Yellow Yellow  Appearance Ur     Clear Clear  Leukocytes,UA     Negative Negative  Protein,UA     Negative/Trace Negative  Glucose, UA     Negative Negative  Ketones, UA     Negative Negative  RBC, UA     Negative 3+ (A)  Bilirubin, UA     Negative Negative  Urobilinogen, Ur     0.2 - 1.0 mg/dL 0.2  Nitrite, UA     Negative Negative  Microscopic Examination      See below:   Component     Latest Ref Rng & Units 11/16/2019  WBC, UA     0 - 5 /hpf 0-5  RBC     0 - 2 /hpf 11-30 (A)  Epithelial Cells (non renal)     0 - 10 /hpf 0-10  Renal Epithel, UA     None seen /hpf 0-10 (A)  Casts     None seen /lpf Present (A)  Cast Type     N/A Granular casts (A)  Crystals     N/A Present (A)  Crystal Type     N/A Amorphous Sediment  Bacteria, UA     None seen/Few Few    I have reviewed the labs.   Pertinent Imaging: Narrative & Impression  CLINICAL DATA:  Severe right flank and abdominal pain, diaphoretic and vomiting  EXAM: CT ABDOMEN AND PELVIS WITHOUT CONTRAST  TECHNIQUE: Multidetector CT imaging of the abdomen and pelvis  was performed following the standard protocol without IV contrast.  COMPARISON:  None.  FINDINGS: Lower chest: Lung bases are clear. Normal heart size. No pericardial effusion.  Hepatobiliary: No visible P[place]ock lesions on this unenhanced CT. Smooth liver surface contour with normal attenuation. Normal gallbladder and biliary tree without visible  calcified gallstone.  Pancreas: Unremarkable. No pancreatic ductal dilatation or surrounding inflammatory changes.  Spleen: Normal in size. No concerning splenic lesions.  Adrenals/Urinary Tract: Normal adrenal glands. The right kidney appears asymmetrically enlarged and slightly hypoattenuating relative to the left kidney likely reflecting some edematous change with surrounding perinephric stranding and moderate hydroureteronephrosis to the level of linear hyperdensity in the right distal ureter which partially protrudes into the right ureterovesicular junction. This could reflect several stacked calculi or an elongated calculus measuring up to 5 mm in diameter though nearly 2 cm in length. Additional nonobstructing calculi present in the lower pole both kidneys. No focal concerning renal lesions. Circumferential bladder wall thickening and with perivesicular hazy stranding is slightly greater than expected for underdistention and could portend a superimposed cystitis.  Stomach/Bowel: Distal esophagus, stomach and duodenal sweep are unremarkable. No small bowel wall thickening or dilatation. No evidence of obstruction. A normal appendix is visualized. No colonic dilatation or wall thickening accounting for underdistention.  Vascular/Lymphatic: No significant vascular findings are present. No enlarged abdominal or pelvic lymph nodes.  Reproductive: Coarse eccentric calcification of the prostate. No concerning abnormalities of the prostate or seminal vesicles.  Other: No abdominopelvic free fluid or free gas. No bowel  containing hernias.  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 Additional mild multilevel discogenic changes elsewhere throughout the included thoracolumbar spine. Sclerotic/lucent lesion noted in the right femoral neck could reflect an atypical synovial cyst or geode formation. Additional sclerotic focus in the intertrochanteric region of the left femur, likely bone island.  IMPRESSION: 1. Moderate right hydroureteronephrosis to the level of linear hyperdensity in the right distal ureter which partially protrudes into the right ureterovesicular junction. This could reflect several stacked calculi or an elongated calculus measuring up to 5 mm in diameter though nearly 2 cm in length. 2. Circumferential bladder wall thickening and with perivesicular hazy stranding is slightly greater than expected for underdistention and could portend a superimposed cystitis. Correlate with urinalysis. 3. Additional nonobstructing calculi in the lower pole both kidneys. 4. 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 5. Benign appearing sclerotic/lucent lesion in the right femoral neck could reflect an atypical synovial cyst or geode formation given absence of aggressive features.   Electronically Signed   By: Kreg Shropshire M.D.   On: 11/04/2019 15:40    I have independently reviewed the films.  See HPI.    Assessment & Plan:    1. Right distal ureteral stone -patient ureteroscopy procedure will be moved up to Monday September 27th -advised the patient that he could take the Percocet 5/325 every 6 hours as needed for pain and to do so if he should feel uncomfortable -he is giving 60 mg IM of Toradol here in the office -he has the Percocet, Zofran and Flomax on hand -Patient has been advised to seek treatment in the ED over the weekend if he should have uncontrollable pain, uncontrollable vomiting or fever/chills  2. Right flank  pain -see # 2  Return for Monday for URS.  These notes generated with voice recognition software. I apologize for typographical errors.  Michiel Cowboy, PA-C  [facility] [address]  [address] [place], [place] [address] [phone]

## 2019-11-19 ENCOUNTER — Ambulatory Visit: Payer: Managed Care, Other (non HMO) | Admitting: Anesthesiology

## 2019-11-19 ENCOUNTER — Encounter: Payer: Self-pay | Admitting: Urology

## 2019-11-19 ENCOUNTER — Ambulatory Visit: Payer: Managed Care, Other (non HMO)

## 2019-11-19 ENCOUNTER — Ambulatory Visit
Admission: RE | Admit: 2019-11-19 | Discharge: 2019-11-19 | Disposition: A | Discharge status: Home / Self Care | Payer: Managed Care, Other (non HMO) | Source: Ambulatory Visit | Attending: Urology | Admitting: Urology

## 2019-11-19 ENCOUNTER — Other Ambulatory Visit: Payer: Self-pay

## 2019-11-19 ENCOUNTER — Encounter
Admission: RE | Disposition: A | Discharge status: Home / Self Care | Payer: Self-pay | Source: Ambulatory Visit | Attending: Urology

## 2019-11-19 ENCOUNTER — Inpatient Hospital Stay: Admission: RE | Admit: 2019-11-19 | Payer: Managed Care, Other (non HMO) | Source: Ambulatory Visit

## 2019-11-19 DIAGNOSIS — N201 Calculus of ureter: Secondary | ICD-10-CM

## 2019-11-19 DIAGNOSIS — I10 Essential (primary) hypertension: Secondary | ICD-10-CM | POA: Diagnosis not present

## 2019-11-19 DIAGNOSIS — N202 Calculus of kidney with calculus of ureter: Secondary | ICD-10-CM | POA: Diagnosis present

## 2019-11-19 HISTORY — DX: Personal history of urinary calculi: Z87.442

## 2019-11-19 HISTORY — DX: Depression, unspecified: F32.A

## 2019-11-19 HISTORY — DX: Family history of other specified conditions: Z84.89

## 2019-11-19 HISTORY — PX: CYSTOSCOPY/URETEROSCOPY/HOLMIUM LASER/STENT PLACEMENT: SHX6546

## 2019-11-19 LAB — URINE DRUG SCREEN, QUALITATIVE (ARMC ONLY)
Amphetamines, Ur Screen: NOT DETECTED
Barbiturates, Ur Screen: NOT DETECTED
Benzodiazepine, Ur Scrn: NOT DETECTED
Cannabinoid 50 Ng, Ur ~~LOC~~: POSITIVE — AB
Cocaine Metabolite,Ur ~~LOC~~: NOT DETECTED
MDMA (Ecstasy)Ur Screen: NOT DETECTED
Methadone Scn, Ur: NOT DETECTED
Opiate, Ur Screen: NOT DETECTED
Phencyclidine (PCP) Ur S: NOT DETECTED
Tricyclic, Ur Screen: NOT DETECTED

## 2019-11-19 SURGERY — CYSTOSCOPY/URETEROSCOPY/HOLMIUM LASER/STENT PLACEMENT
Anesthesia: General | Laterality: Right

## 2019-11-19 MED ORDER — HYDRALAZINE HCL 20 MG/ML IJ SOLN
INTRAMUSCULAR | Status: AC
  Filled 2019-11-19: qty 1

## 2019-11-19 MED ORDER — CEFAZOLIN SODIUM-DEXTROSE 2-4 GM/100ML-% IV SOLN
INTRAVENOUS | Status: AC
  Filled 2019-11-19: qty 100

## 2019-11-19 MED ORDER — SUCCINYLCHOLINE CHLORIDE 20 MG/ML IJ SOLN
INTRAMUSCULAR | Status: DC | PRN
  Administered 2019-11-19: 120 mg via INTRAVENOUS

## 2019-11-19 MED ORDER — ONDANSETRON HCL 4 MG/2ML IJ SOLN: 4.0000 mg | Freq: Once | INTRAMUSCULAR | Status: DC | PRN

## 2019-11-19 MED ORDER — ROCURONIUM BROMIDE 10 MG/ML (PF) SYRINGE
PREFILLED_SYRINGE | INTRAVENOUS | Status: AC
  Filled 2019-11-19: qty 10

## 2019-11-19 MED ORDER — CHLORHEXIDINE GLUCONATE 0.12 % MT SOLN: 15.0000 mL | Freq: Once | OROMUCOSAL | Status: AC

## 2019-11-19 MED ORDER — FAMOTIDINE 20 MG PO TABS
ORAL_TABLET | ORAL | Status: AC
  Administered 2019-11-19: 20 mg via ORAL
  Filled 2019-11-19: qty 1

## 2019-11-19 MED ORDER — OXYBUTYNIN CHLORIDE ER 10 MG PO TB24: 10.0000 mg | ORAL_TABLET | Freq: Every day | ORAL | 0 refills | Status: AC | PRN

## 2019-11-19 MED ORDER — FENTANYL CITRATE (PF) 100 MCG/2ML IJ SOLN
INTRAMUSCULAR | Status: AC
  Administered 2019-11-19: 25 ug via INTRAVENOUS
  Filled 2019-11-19: qty 2

## 2019-11-19 MED ORDER — ONDANSETRON HCL 4 MG/2ML IJ SOLN
INTRAMUSCULAR | Status: DC | PRN
  Administered 2019-11-19: 4 mg via INTRAVENOUS

## 2019-11-19 MED ORDER — SUCCINYLCHOLINE CHLORIDE 200 MG/10ML IV SOSY
PREFILLED_SYRINGE | INTRAVENOUS | Status: AC
  Filled 2019-11-19: qty 10

## 2019-11-19 MED ORDER — CHLORHEXIDINE GLUCONATE 0.12 % MT SOLN
OROMUCOSAL | Status: AC
  Administered 2019-11-19: 15 mL via OROMUCOSAL
  Filled 2019-11-19: qty 15

## 2019-11-19 MED ORDER — LIDOCAINE HCL (CARDIAC) PF 100 MG/5ML IV SOSY
PREFILLED_SYRINGE | INTRAVENOUS | Status: DC | PRN
  Administered 2019-11-19: 100 mg via INTRAVENOUS

## 2019-11-19 MED ORDER — IOHEXOL 180 MG/ML  SOLN
INTRAMUSCULAR | Status: DC | PRN
  Administered 2019-11-19: 20 mL

## 2019-11-19 MED ORDER — FENTANYL CITRATE (PF) 100 MCG/2ML IJ SOLN
INTRAMUSCULAR | Status: DC | PRN
Start: 2019-11-19 — End: 2019-11-19
  Administered 2019-11-19: 100 ug via INTRAVENOUS

## 2019-11-19 MED ORDER — MIDAZOLAM HCL 2 MG/2ML IJ SOLN
INTRAMUSCULAR | Status: DC | PRN
  Administered 2019-11-19: 2 mg via INTRAVENOUS

## 2019-11-19 MED ORDER — ROCURONIUM BROMIDE 100 MG/10ML IV SOLN
INTRAVENOUS | Status: DC | PRN
  Administered 2019-11-19: 10 mg via INTRAVENOUS
  Administered 2019-11-19: 40 mg via INTRAVENOUS
  Administered 2019-11-19: 10 mg via INTRAVENOUS

## 2019-11-19 MED ORDER — FENTANYL CITRATE (PF) 100 MCG/2ML IJ SOLN
INTRAMUSCULAR | Status: AC
  Filled 2019-11-19: qty 2

## 2019-11-19 MED ORDER — MIDAZOLAM HCL 2 MG/2ML IJ SOLN
INTRAMUSCULAR | Status: AC
  Filled 2019-11-19: qty 2

## 2019-11-19 MED ORDER — PROPOFOL 10 MG/ML IV BOLUS
INTRAVENOUS | Status: DC | PRN
  Administered 2019-11-19: 100 mg via INTRAVENOUS
  Administered 2019-11-19: 200 mg via INTRAVENOUS

## 2019-11-19 MED ORDER — PROPOFOL 10 MG/ML IV BOLUS
INTRAVENOUS | Status: AC
  Filled 2019-11-19: qty 20

## 2019-11-19 MED ORDER — DEXAMETHASONE SODIUM PHOSPHATE 10 MG/ML IJ SOLN
INTRAMUSCULAR | Status: AC
  Filled 2019-11-19: qty 1

## 2019-11-19 MED ORDER — SUGAMMADEX SODIUM 200 MG/2ML IV SOLN
INTRAVENOUS | Status: DC | PRN
  Administered 2019-11-19: 200 mg via INTRAVENOUS

## 2019-11-19 MED ORDER — LACTATED RINGERS IV SOLN: INTRAVENOUS | Status: DC

## 2019-11-19 MED ORDER — LIDOCAINE HCL (PF) 2 % IJ SOLN
INTRAMUSCULAR | Status: AC
  Filled 2019-11-19: qty 5

## 2019-11-19 MED ORDER — DEXAMETHASONE SODIUM PHOSPHATE 10 MG/ML IJ SOLN
INTRAMUSCULAR | Status: DC | PRN
  Administered 2019-11-19: 10 mg via INTRAVENOUS

## 2019-11-19 MED ORDER — ONDANSETRON HCL 4 MG/2ML IJ SOLN
INTRAMUSCULAR | Status: AC
  Filled 2019-11-19: qty 2

## 2019-11-19 MED ORDER — LABETALOL HCL 5 MG/ML IV SOLN
INTRAVENOUS | Status: AC
  Administered 2019-11-19: 10 mg via INTRAVENOUS
  Filled 2019-11-19: qty 4

## 2019-11-19 MED ORDER — CEFAZOLIN SODIUM-DEXTROSE 2-4 GM/100ML-% IV SOLN
2.0000 g | INTRAVENOUS | Status: AC
  Administered 2019-11-19: 2 g via INTRAVENOUS

## 2019-11-19 MED ORDER — KETOROLAC TROMETHAMINE 30 MG/ML IJ SOLN
INTRAMUSCULAR | Status: AC
  Filled 2019-11-19: qty 1

## 2019-11-19 MED ORDER — ORAL CARE MOUTH RINSE: 15.0000 mL | Freq: Once | OROMUCOSAL | Status: AC

## 2019-11-19 MED ORDER — HYDRALAZINE HCL 20 MG/ML IJ SOLN
10.0000 mg | Freq: Once | INTRAMUSCULAR | Status: AC
  Administered 2019-11-19: 10 mg via INTRAVENOUS
  Filled 2019-11-19: qty 0.5

## 2019-11-19 MED ORDER — FENTANYL CITRATE (PF) 100 MCG/2ML IJ SOLN
25.0000 ug | INTRAMUSCULAR | Status: DC | PRN
  Administered 2019-11-19 (×3): 25 ug via INTRAVENOUS

## 2019-11-19 MED ORDER — KETOROLAC TROMETHAMINE 30 MG/ML IJ SOLN
INTRAMUSCULAR | Status: DC | PRN
  Administered 2019-11-19: 15 mg via INTRAVENOUS

## 2019-11-19 MED ORDER — FAMOTIDINE 20 MG PO TABS: 20.0000 mg | ORAL_TABLET | Freq: Once | ORAL | Status: AC

## 2019-11-19 MED ORDER — OXYCODONE-ACETAMINOPHEN 5-325 MG PO TABS: 1.0000 | ORAL_TABLET | ORAL | 0 refills | Status: AC | PRN

## 2019-11-19 MED ORDER — LABETALOL HCL 5 MG/ML IV SOLN
10.0000 mg | INTRAVENOUS | Status: AC | PRN
  Administered 2019-11-19: 10 mg via INTRAVENOUS

## 2019-11-19 MED ORDER — BELLADONNA ALKALOIDS-OPIUM 16.2-60 MG RE SUPP
RECTAL | Status: AC
  Filled 2019-11-19: qty 1

## 2019-11-19 MED ORDER — DEXMEDETOMIDINE (PRECEDEX) IN NS 20 MCG/5ML (4 MCG/ML) IV SYRINGE
PREFILLED_SYRINGE | INTRAVENOUS | Status: AC
  Filled 2019-11-19: qty 5

## 2019-11-19 MED ORDER — DEXMEDETOMIDINE (PRECEDEX) IN NS 20 MCG/5ML (4 MCG/ML) IV SYRINGE
PREFILLED_SYRINGE | INTRAVENOUS | Status: DC | PRN
  Administered 2019-11-19: 8 ug via INTRAVENOUS
  Administered 2019-11-19: 12 ug via INTRAVENOUS

## 2019-11-19 SURGICAL SUPPLY — 34 items
BAG DRAIN CYSTO-URO LG1000N (MISCELLANEOUS) ×2 IMPLANT
BRUSH SCRUB EZ 1% IODOPHOR (MISCELLANEOUS) ×2 IMPLANT
CATH URETL 5X70 OPEN END (CATHETERS) IMPLANT
CNTNR SPEC 2.5X3XGRAD LEK (MISCELLANEOUS)
CONT SPEC 4OZ STER OR WHT (MISCELLANEOUS)
CONT SPEC 4OZ STRL OR WHT (MISCELLANEOUS)
CONTAINER SPEC 2.5X3XGRAD LEK (MISCELLANEOUS) IMPLANT
DRAPE UTILITY 15X26 TOWEL STRL (DRAPES) ×2 IMPLANT
FIBER LASER TRAC TIP (UROLOGICAL SUPPLIES) IMPLANT
FIBER LASER TRACTIP 200 (UROLOGICAL SUPPLIES) ×2 IMPLANT
GLOVE BIOGEL PI IND STRL 7.5 (GLOVE) ×1 IMPLANT
GLOVE BIOGEL PI INDICATOR 7.5 (GLOVE) ×1
GOWN STRL REUS W/ TWL LRG LVL3 (GOWN DISPOSABLE) ×1 IMPLANT
GOWN STRL REUS W/ TWL XL LVL3 (GOWN DISPOSABLE) ×1 IMPLANT
GOWN STRL REUS W/TWL LRG LVL3 (GOWN DISPOSABLE) ×2
GOWN STRL REUS W/TWL XL LVL3 (GOWN DISPOSABLE) ×2
GUIDEWIRE GREEN .038 145CM (MISCELLANEOUS) ×2 IMPLANT
GUIDEWIRE STR DUAL SENSOR (WIRE) ×2 IMPLANT
INFUSOR MANOMETER BAG 3000ML (MISCELLANEOUS) ×2 IMPLANT
INTRODUCER DILATOR DOUBLE (INTRODUCER) IMPLANT
KIT TURNOVER CYSTO (KITS) ×2 IMPLANT
PACK CYSTO AR (MISCELLANEOUS) ×2 IMPLANT
SET CYSTO W/LG BORE CLAMP LF (SET/KITS/TRAYS/PACK) ×2 IMPLANT
SHEATH URETERAL 12FRX35CM (MISCELLANEOUS) IMPLANT
SOL .9 NS 3000ML IRR  AL (IV SOLUTION) ×1
SOL .9 NS 3000ML IRR AL (IV SOLUTION) ×1
SOL .9 NS 3000ML IRR UROMATIC (IV SOLUTION) ×1 IMPLANT
STENT URET 6FRX24 CONTOUR (STENTS) IMPLANT
STENT URET 6FRX26 CONTOUR (STENTS) IMPLANT
STENT URET 6FRX28 CONTOUR (STENTS) ×2 IMPLANT
SURGILUBE 2OZ TUBE FLIPTOP (MISCELLANEOUS) ×2 IMPLANT
SYR 10ML LL (SYRINGE) ×2 IMPLANT
VALVE UROSEAL ADJ ENDO (VALVE) ×2 IMPLANT
WATER STERILE IRR 1000ML POUR (IV SOLUTION) ×2 IMPLANT

## 2019-11-19 NOTE — Anesthesia Procedure Notes (Signed)
Procedure Name: Intubation Date/Time: 11/19/2019 5:24 PM Performed by: Jaye Beagle, CRNA Pre-anesthesia Checklist: Patient identified, Emergency Drugs available, Suction available and Patient being monitored Patient Re-evaluated:Patient Re-evaluated prior to induction Oxygen Delivery Method: Circle system utilized Preoxygenation: Pre-oxygenation with 100% oxygen Induction Type: IV induction Ventilation: Mask ventilation without difficulty Laryngoscope Size: McGraph and 4 Grade View: Grade I Tube type: Oral Tube size: 7.0 mm Number of attempts: 1 Airway Equipment and Method: Stylet and Oral airway Placement Confirmation: ETT inserted through vocal cords under direct vision,  positive ETCO2 and breath sounds checked- equal and bilateral Secured at: 23 cm Tube secured with: Tape Dental Injury: Teeth and Oropharynx as per pre-operative assessment

## 2019-11-19 NOTE — Transfer of Care (Signed)
Immediate Anesthesia Transfer of Care Note  Patient: Brandon Wyatt  Procedure(s) Performed: CYSTOSCOPY/URETEROSCOPY/HOLMIUM LASER/STENT PLACEMENT (Right )  Patient Location: PACU  Anesthesia Type:General  Level of Consciousness: drowsy  Airway & Oxygen Therapy: Patient Spontanous Breathing, Patient connected to nasal cannula oxygen and Patient connected to face mask oxygen  Post-op Assessment: Report given to RN  Post vital signs: stable  Last Vitals:  Vitals Value Taken Time  BP 153/105 11/19/19 1808  Temp    Pulse 99 11/19/19 1809  Resp 12 11/19/19 1809  SpO2 100 % 11/19/19 1809  Vitals shown include unvalidated device data.  Last Pain:  Vitals:   11/19/19 1418  TempSrc: Temporal  PainSc: 1          Complications: No complications documented.

## 2019-11-19 NOTE — Interval H&P Note (Signed)
UROLOGY H&P UPDATE  Agree with prior H&P dated 11/13/2019.  Right distal ureteral stone with upstream hydronephrosis and refractory renal colic  Cardiac: RRR Lungs: CTA bilaterally  Laterality: Right Procedure: Right ureteroscopy, laser lithotripsy, stent placement  Urine: Urinalysis 9/24 benign aside from 11-30 RBCs  We specifically discussed the risks ureteroscopy including bleeding, infection/sepsis, stent related symptoms including flank pain/urgency/frequency/incontinence/dysuria, ureteral injury, inability to access stone, or need for staged or additional procedures.   Sondra Come, MD 11/19/2019

## 2019-11-19 NOTE — Op Note (Addendum)
Date of procedure: 11/19/19   Preoperative diagnosis:  1. Right distal ureteral stone 2. Right renal stone  Postoperative diagnosis:  1. Right renal stone  Procedure: 1. Cystoscopy, right retrograde pyelogram with intraoperative interpretation, right ureteroscopy, laser lithotripsy, stent placement  Surgeon: Nickolas Madrid, MD  Anesthesia: General  Complications: None  Intraoperative findings:  1.  Normal cystoscopy 2.  Right distal ureteral stone had spontaneously passed 3.  Uncomplicated dusting of right lower pole fragments 4.  Uncomplicated stent placement on Dangler   EBL: Minimal  Specimens: None  Drains: Right 6 French by 28 cm ureteral stent  Indication: Brandon Wyatt is a 34 y.o. patient with a large right distal ureteral stone and refractory renal colic.  After reviewing the management options for treatment, they elected to proceed with the above surgical procedure(s). We have discussed the potential benefits and risks of the procedure, side effects of the proposed treatment, the likelihood of the patient achieving the goals of the procedure, and any potential problems that might occur during the procedure or recuperation. Informed consent has been obtained.  Description of procedure:  The patient was taken to the operating room and general anesthesia was induced. SCDs were placed for DVT prophylaxis. The patient was placed in the dorsal lithotomy position, prepped and draped in the usual sterile fashion, and preoperative antibiotics were administered. A preoperative time-out was performed.   A 21 French rigid cystoscope was used to intubate the urethra and thorough cystoscopy was performed.  The bladder was grossly normal.  A sensor wire advanced easily into the right ureteral orifice and passed easily up into the kidney.  The semirigid ureteroscope was passed alongside the wire, and thorough distal and mid ureteroscopy revealed no stones or abnormalities.  I then  attempted to pass the single channel flexible ureteroscope over the wire, but met resistance at the ureteral orifice.  The sensor wire was exchanged using the access catheter for Super Stiff wire, and the flexible ureteroscope then advanced easily over the wire up into the kidney.  Thorough pyeloscopy revealed numerous stone fragments in the lower pole consistent with his prior CT.  A 200 m laser fiber on settings of 0.5 J and 40 Hz was used to dust <1 mm fragments.  Contrast was injected from the proximal ureter and showed no hydronephrosis, filling defects, or extravasation.  Careful pullback ureteroscopy revealed no ureteral fragments and a widely patent ureter.  A rigid cystoscope was then used to reintubate the urethra and a sensor wire was passed back up into the right kidney, and a 6 [place] by 28 cm stent with a Dangler attached was uneventfully placed with an excellent curl in the upper pole, as well as under direct vision the bladder.  The Dangler was attached to the penis with Tegaderm.  The bladder was drained and this concluded our procedure.  The stone fragments were too small to send for analysis.  Disposition: Stable to PACU  Plan: Remove stent at home on Wednesday morning Suspect uric acid stones, follow-up in clinic in 4 to 6 weeks for renal ultrasound and UA, consider potassium citrate and behavioral strategies for stone prevention  Nickolas Madrid, MD

## 2019-11-19 NOTE — Anesthesia Preprocedure Evaluation (Signed)
Anesthesia Evaluation  Patient identified by MRN, date of birth, ID band Patient awake    Reviewed: Allergy & Precautions, NPO status , Patient's Chart, lab work & pertinent test results  History of Anesthesia Complications (+) Family history of anesthesia reaction  Airway Mallampati: II  TM Distance: >3 FB     Dental   Pulmonary neg pulmonary ROS,    Pulmonary exam normal        Cardiovascular hypertension, Normal cardiovascular exam     Neuro/Psych PSYCHIATRIC DISORDERS Depression negative neurological ROS     GI/Hepatic negative GI ROS, Neg liver ROS,   Endo/Other  negative endocrine ROS  Renal/GU negative Renal ROS     Musculoskeletal negative musculoskeletal ROS (+)   Abdominal Normal abdominal exam  (+)   Peds negative pediatric ROS (+)  Hematology negative hematology ROS (+)   Anesthesia Other Findings Past Medical History: No date: Depression No date: Family history of adverse reaction to anesthesia     Comment:  grandfatehr was hallucinating, possible due to               medication and due to age  No date: History of kidney stones No date: Hypertension  Reproductive/Obstetrics                             Anesthesia Physical Anesthesia Plan  ASA: II  Anesthesia Plan: General   Post-op Pain Management:    Induction: Intravenous  PONV Risk Score and Plan:   Airway Management Planned: Oral ETT  Additional Equipment:   Intra-op Plan:   Post-operative Plan: Extubation in OR  Informed Consent: I have reviewed the patients History and Physical, chart, labs and discussed the procedure including the risks, benefits and alternatives for the proposed anesthesia with the patient or authorized representative who has indicated his/her understanding and acceptance.     Dental advisory given  Plan Discussed with: CRNA and Surgeon  Anesthesia Plan Comments:          Anesthesia Quick Evaluation

## 2019-11-19 NOTE — Discharge Instructions (Signed)

## 2019-11-20 ENCOUNTER — Encounter: Payer: Self-pay | Admitting: Urology

## 2019-11-20 ENCOUNTER — Other Ambulatory Visit: Payer: Managed Care, Other (non HMO)

## 2019-11-21 NOTE — Anesthesia Postprocedure Evaluation (Signed)
Anesthesia Post Note  Patient: Brandon Wyatt  Procedure(s) Performed: CYSTOSCOPY/URETEROSCOPY/HOLMIUM LASER/STENT PLACEMENT (Right )  Patient location during evaluation: PACU Anesthesia Type: General Level of consciousness: awake and alert and oriented Pain management: pain level controlled Vital Signs Assessment: post-procedure vital signs reviewed and stable Respiratory status: spontaneous breathing Cardiovascular status: blood pressure returned to baseline Anesthetic complications: no   No complications documented.   Last Vitals:  Vitals:   11/19/19 1938 11/19/19 1945  BP: (!) 158/102 (!) 148/94  Pulse: 88   Resp: 16   Temp:    SpO2: 100%     Last Pain:  Vitals:   11/20/19 1101  TempSrc:   PainSc: 2                  Cailie Bosshart

## 2020-01-02 ENCOUNTER — Other Ambulatory Visit: Payer: Self-pay

## 2020-01-02 ENCOUNTER — Ambulatory Visit (INDEPENDENT_AMBULATORY_CARE_PROVIDER_SITE_OTHER): Payer: Managed Care, Other (non HMO) | Admitting: Urology

## 2020-01-02 ENCOUNTER — Encounter: Payer: Self-pay | Admitting: Urology

## 2020-01-02 VITALS — BP 136/90 | Ht 72.0 in | Wt 275.0 lb

## 2020-01-02 DIAGNOSIS — N200 Calculus of kidney: Secondary | ICD-10-CM

## 2020-01-02 NOTE — Progress Notes (Signed)
   01/02/2020 9:43 AM   Brandon Wyatt [birth year] [id number]  Reason for visit: Follow up nephrolithiasis  HPI: I saw Brandon Wyatt in urology clinic today for follow-up of nephrolithiasis.  He is a 34 year old male who presented in September with right-sided flank pain and was found to have a distal ureteral stone as well as some right lower pole stones.  He underwent uncomplicated ureteroscopy, laser lithotripsy, and stent placement on Dangler.  He had uric acid stones.  Urinalysis today notable for a pH of 6.0, but otherwise benign.  He did not have his follow-up renal ultrasound performed.  We discussed stone prevention strategies at length regarding uric acid stones including increased urine output with goal of 2.5 L/day, decreasing animal proteins in the diet, and strategies for alkalinization of the urine.  He is interested in starting with litholyte, and would like to hold off on potassium citrate at this time unless he forms recurrent stones.  He would like to follow-up as needed, return precautions discussed at length   Sondra Come, MD  [facility] [address], [address] [place], [place] [address] [phone]

## 2020-01-02 NOTE — Patient Instructions (Addendum)
1. Increase fluids with goal og 2.5 liter of urine output per day 2. Minimize animal proteins in diet  Dietary Guidelines to Help Prevent Kidney Stones Kidney stones are deposits of minerals and salts that form inside your kidneys. Your risk of developing kidney stones may be greater depending on your diet, your lifestyle, the medicines you take, and whether you have certain medical conditions. Most people can reduce their chances of developing kidney stones by following the instructions below. Depending on your overall health and the type of kidney stones you tend to develop, your dietitian may give you more specific instructions. What are tips for following this plan? Reading food labels  Choose foods with "no salt added" or "low-salt" labels. Limit your sodium intake to less than 1500 mg per day.  Choose foods with calcium for each meal and snack. Try to eat about 300 mg of calcium at each meal. Foods that contain 200-500 mg of calcium per serving include: ? 8 oz (237 ml) of milk, fortified nondairy milk, and fortified fruit juice. ? 8 oz (237 ml) of kefir, yogurt, and soy yogurt. ? 4 oz (118 ml) of tofu. ? 1 oz of cheese. ? 1 cup (300 g) of dried figs. ? 1 cup (91 g) of cooked broccoli. ? 1-3 oz can of sardines or mackerel.  Most people need 1000 to 1500 mg of calcium each day. Talk to your dietitian about how much calcium is recommended for you. Shopping  Buy plenty of fresh fruits and vegetables. Most people do not need to avoid fruits and vegetables, even if they contain nutrients that may contribute to kidney stones.  When shopping for convenience foods, choose: ? Whole pieces of fruit. ? Premade salads with dressing on the side. ? Low-fat fruit and yogurt smoothies.  Avoid buying frozen meals or prepared deli foods.  Look for foods with live cultures, such as yogurt and kefir. Cooking  Do not add salt to food when cooking. Place a salt shaker on the table and allow each  person to add his or her own salt to taste.  Use vegetable protein, such as beans, textured vegetable protein (TVP), or tofu instead of meat in pasta, casseroles, and soups. Meal planning   Eat less salt, if told by your dietitian. To do this: ? Avoid eating processed or premade food. ? Avoid eating fast food.  Eat less animal protein, including cheese, meat, poultry, or fish, if told by your dietitian. To do this: ? Limit the number of times you have meat, poultry, fish, or cheese each week. Eat a diet free of meat at least 2 days a week. ? Eat only one serving each day of meat, poultry, fish, or seafood. ? When you prepare animal protein, cut pieces into small portion sizes. For most meat and fish, one serving is about the size of one deck of cards.  Eat at least 5 servings of fresh fruits and vegetables each day. To do this: ? Keep fruits and vegetables on hand for snacks. ? Eat 1 piece of fruit or a handful of berries with breakfast. ? Have a salad and fruit at lunch. ? Have two kinds of vegetables at dinner.  Limit foods that are high in a substance called oxalate. These include: ? Spinach. ? Rhubarb. ? Beets. ? Potato chips and french fries. ? Nuts.  If you regularly take a diuretic medicine, make sure to eat at least 1-2 fruits or vegetables high in potassium each day. These include: ?  Avocado. ? Banana. ? Orange, prune, carrot, or tomato juice. ? Baked potato. ? Cabbage. ? Beans and split peas. General instructions   Drink enough fluid to keep your urine clear or pale yellow. This is the most important thing you can do.  Talk to your health care provider and dietitian about taking daily supplements. Depending on your health and the cause of your kidney stones, you may be advised: ? Not to take supplements with vitamin C. ? To take a calcium supplement. ? To take a daily probiotic supplement. ? To take other supplements such as magnesium, fish oil, or vitamin  B6.  Take all medicines and supplements as told by your health care provider.  Limit alcohol intake to no more than 1 drink a day for nonpregnant women and 2 drinks a day for men. One drink equals 12 oz of beer, 5 oz of wine, or 1 oz of hard liquor.  Lose weight if told by your health care provider. Work with your dietitian to find strategies and an eating plan that works best for you. What foods are not recommended? Limit your intake of the following foods, or as told by your dietitian. Talk to your dietitian about specific foods you should avoid based on the type of kidney stones and your overall health. Grains Breads. Bagels. Rolls. Baked goods. Salted crackers. Cereal. Pasta. Vegetables Spinach. Rhubarb. Beets. Canned vegetables. Rosita Fire. Olives. Meats and other protein foods Nuts. Nut butters. Large portions of meat, poultry, or fish. Salted or cured meats. Deli meats. Hot dogs. Sausages. Dairy Cheese. Beverages Regular soft drinks. Regular vegetable juice. Seasonings and other foods Seasoning blends with salt. Salad dressings. Canned soups. Soy sauce. Ketchup. Barbecue sauce. Canned pasta sauce. Casseroles. Pizza. Lasagna. Frozen meals. Potato chips. [place] fries. Summary  You can reduce your risk of kidney stones by making changes to your diet.  The most important thing you can do is drink enough fluid. You should drink enough fluid to keep your urine clear or pale yellow.  Ask your health care provider or dietitian how much protein from animal sources you should eat each day, and also how much salt and calcium you should have each day. This information is not intended to replace advice given to you by your health care provider. Make sure you discuss any questions you have with your health care provider. Document Revised: 05/31/2018 Document Reviewed: 01/20/2016 Elsevier Patient Education  2020 [employer]  Dietary Guidelines to Help Prevent Kidney Stones Kidney stones are  deposits of minerals and salts that form inside your kidneys. Your risk of developing kidney stones may be greater depending on your diet, your lifestyle, the medicines you take, and whether you have certain medical conditions. Most people can reduce their chances of developing kidney stones by following the instructions below. Depending on your overall health and the type of kidney stones you tend to develop, your dietitian may give you more specific instructions. What are tips for following this plan? Reading food labels  Choose foods with "no salt added" or "low-salt" labels. Limit your sodium intake to less than 1500 mg per day.  Choose foods with calcium for each meal and snack. Try to eat about 300 mg of calcium at each meal. Foods that contain 200-500 mg of calcium per serving include: ? 8 oz (237 ml) of milk, fortified nondairy milk, and fortified fruit juice. ? 8 oz (237 ml) of kefir, yogurt, and soy yogurt. ? 4 oz (118 ml) of tofu. ? 1  oz of cheese. ? 1 cup (300 g) of dried figs. ? 1 cup (91 g) of cooked broccoli. ? 1-3 oz can of sardines or mackerel.  Most people need 1000 to 1500 mg of calcium each day. Talk to your dietitian about how much calcium is recommended for you. Shopping  Buy plenty of fresh fruits and vegetables. Most people do not need to avoid fruits and vegetables, even if they contain nutrients that may contribute to kidney stones.  When shopping for convenience foods, choose: ? Whole pieces of fruit. ? Premade salads with dressing on the side. ? Low-fat fruit and yogurt smoothies.  Avoid buying frozen meals or prepared deli foods.  Look for foods with live cultures, such as yogurt and kefir. Cooking  Do not add salt to food when cooking. Place a salt shaker on the table and allow each person to add his or her own salt to taste.  Use vegetable protein, such as beans, textured vegetable protein (TVP), or tofu instead of meat in pasta, casseroles, and  soups. Meal planning   Eat less salt, if told by your dietitian. To do this: ? Avoid eating processed or premade food. ? Avoid eating fast food.  Eat less animal protein, including cheese, meat, poultry, or fish, if told by your dietitian. To do this: ? Limit the number of times you have meat, poultry, fish, or cheese each week. Eat a diet free of meat at least 2 days a week. ? Eat only one serving each day of meat, poultry, fish, or seafood. ? When you prepare animal protein, cut pieces into small portion sizes. For most meat and fish, one serving is about the size of one deck of cards.  Eat at least 5 servings of fresh fruits and vegetables each day. To do this: ? Keep fruits and vegetables on hand for snacks. ? Eat 1 piece of fruit or a handful of berries with breakfast. ? Have a salad and fruit at lunch. ? Have two kinds of vegetables at dinner.  Limit foods that are high in a substance called oxalate. These include: ? Spinach. ? Rhubarb. ? Beets. ? Potato chips and french fries. ? Nuts.  If you regularly take a diuretic medicine, make sure to eat at least 1-2 fruits or vegetables high in potassium each day. These include: ? Avocado. ? Banana. ? Orange, prune, carrot, or tomato juice. ? Baked potato. ? Cabbage. ? Beans and split peas. General instructions   Drink enough fluid to keep your urine clear or pale yellow. This is the most important thing you can do.  Talk to your health care provider and dietitian about taking daily supplements. Depending on your health and the cause of your kidney stones, you may be advised: ? Not to take supplements with vitamin C. ? To take a calcium supplement. ? To take a daily probiotic supplement. ? To take other supplements such as magnesium, fish oil, or vitamin B6.  Take all medicines and supplements as told by your health care provider.  Limit alcohol intake to no more than 1 drink a day for nonpregnant women and 2 drinks a day  for men. One drink equals 12 oz of beer, 5 oz of wine, or 1 oz of hard liquor.  Lose weight if told by your health care provider. Work with your dietitian to find strategies and an eating plan that works best for you. What foods are not recommended? Limit your intake of the following foods, or as  told by your dietitian. Talk to your dietitian about specific foods you should avoid based on the type of kidney stones and your overall health. Grains Breads. Bagels. Rolls. Baked goods. Salted crackers. Cereal. Pasta. Vegetables Spinach. Rhubarb. Beets. Canned vegetables. Rosita FirePickles. Olives. Meats and other protein foods Nuts. Nut butters. Large portions of meat, poultry, or fish. Salted or cured meats. Deli meats. Hot dogs. Sausages. Dairy Cheese. Beverages Regular soft drinks. Regular vegetable juice. Seasonings and other foods Seasoning blends with salt. Salad dressings. Canned soups. Soy sauce. Ketchup. Barbecue sauce. Canned pasta sauce. Casseroles. Pizza. Lasagna. Frozen meals. Potato chips. [place] fries. Summary  You can reduce your risk of kidney stones by making changes to your diet.  The most important thing you can do is drink enough fluid. You should drink enough fluid to keep your urine clear or pale yellow.  Ask your health care provider or dietitian how much protein from animal sources you should eat each day, and also how much salt and calcium you should have each day. This information is not intended to replace advice given to you by your health care provider. Make sure you discuss any questions you have with your health care provider. Document Revised: 05/31/2018 Document Reviewed: 01/20/2016 Elsevier Patient Education  2020 [employer]

## 2020-01-03 LAB — URINALYSIS, COMPLETE
Bilirubin, UA: NEGATIVE
Glucose, UA: NEGATIVE
Ketones, UA: NEGATIVE
Leukocytes,UA: NEGATIVE
Nitrite, UA: NEGATIVE
Protein,UA: NEGATIVE
RBC, UA: NEGATIVE
Specific Gravity, UA: 1.02 (ref 1.005–1.030)
Urobilinogen, Ur: 0.2 mg/dL (ref 0.2–1.0)
pH, UA: 6 (ref 5.0–7.5)

## 2020-01-03 LAB — MICROSCOPIC EXAMINATION: Bacteria, UA: NONE SEEN

## 2020-11-28 ENCOUNTER — Ambulatory Visit: Payer: Managed Care, Other (non HMO) | Admitting: Podiatry

## 2020-11-28 ENCOUNTER — Encounter: Payer: Self-pay | Admitting: Podiatry

## 2020-11-28 ENCOUNTER — Other Ambulatory Visit: Payer: Self-pay

## 2020-11-28 DIAGNOSIS — L600 Ingrowing nail: Secondary | ICD-10-CM | POA: Diagnosis not present

## 2020-11-28 MED ORDER — GENTAMICIN SULFATE 0.1 % EX CREA: 1.0000 "application " | TOPICAL_CREAM | Freq: Two times a day (BID) | CUTANEOUS | 1 refills | Status: AC

## 2020-11-28 MED ORDER — DOXYCYCLINE HYCLATE 100 MG PO TABS: 100.0000 mg | ORAL_TABLET | Freq: Two times a day (BID) | ORAL | 0 refills | Status: DC

## 2020-11-28 NOTE — Progress Notes (Signed)
   Subjective: Patient presents today for evaluation of pain to the lateral border of the bilateral great toes. Patient is concerned for possible ingrown nail.  It is very sensitive to touch.  Patient presents today for further treatment and evaluation.  Past Medical History:  Diagnosis Date   Depression    Family history of adverse reaction to anesthesia    grandfatehr was hallucinating, possible due to medication and due to age    History of kidney stones    Hypertension     Objective:  General: Well developed, nourished, in no acute distress, alert and oriented x3   Dermatology: Skin is warm, dry and supple bilateral.  Lateral border bilateral great toes appears to be erythematous with evidence of an ingrowing nail. Pain on palpation noted to the border of the nail fold. The remaining nails appear unremarkable at this time. There are no open sores, lesions.  Vascular: Dorsalis Pedis artery and Posterior Tibial artery pedal pulses palpable. No lower extremity edema noted.   Neruologic: Grossly intact via light touch bilateral.  Musculoskeletal: Muscular strength within normal limits in all groups bilateral. Normal range of motion noted to all pedal and ankle joints.   Assesement: #1 Paronychia with ingrowing nail lateral border bilateral great toes #2 Pain in toe  Plan of Care:  1. Patient evaluated.  2. Discussed treatment alternatives and plan of care. Explained nail avulsion procedure and post procedure course to patient. 3. Patient opted for permanent partial nail avulsion of the ingrown portion of the nail.  4. Prior to procedure, local anesthesia infiltration utilized using 3 ml of a 50:50 mixture of 2% plain lidocaine and 0.5% plain marcaine in a normal hallux block fashion and a betadine prep performed.  5. Partial permanent nail avulsion with chemical matrixectomy performed using 3x30sec applications of phenol followed by alcohol flush.  6. Light dressing applied.  Post  care instructions provided 7.  Prescription for gentamicin 2% cream  8.  Prescription for doxycycline 100 mg 2 times daily #20  9.  Return to clinic 2 weeks.  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 Felecia Shelling, DPM    [address] [place], [place] [address]                Office [phone]  Fax [phone]

## 2020-12-19 ENCOUNTER — Encounter (INDEPENDENT_AMBULATORY_CARE_PROVIDER_SITE_OTHER): Payer: Self-pay

## 2020-12-19 ENCOUNTER — Other Ambulatory Visit: Payer: Self-pay

## 2020-12-19 ENCOUNTER — Encounter: Payer: Self-pay | Admitting: Podiatry

## 2020-12-19 ENCOUNTER — Ambulatory Visit: Payer: Managed Care, Other (non HMO) | Admitting: Podiatry

## 2020-12-19 DIAGNOSIS — L600 Ingrowing nail: Secondary | ICD-10-CM

## 2020-12-19 NOTE — Progress Notes (Signed)
   Subjective: 35 y.o. male presents today status post permanent nail avulsion procedure of the bilateral borders of bilateral great toes that was performed on 11/28/2020.  Patient states that he does have some slight tenderness but overall there is improvement he is doing well.  Past Medical History:  Diagnosis Date   Depression    Family history of adverse reaction to anesthesia    grandfatehr was hallucinating, possible due to medication and due to age    History of kidney stones    Hypertension     Objective: Skin is warm, dry and supple. Nail and respective nail fold appears to be healing appropriately. Open wound to the associated nail fold with a granular wound base and moderate amount of fibrotic tissue. Minimal drainage noted. Mild erythema around the periungual region likely due to phenol chemical matricectomy.  Assessment: #1 s/p partial permanent nail matrixectomy bilateral borders bilateral great toes   Plan of care: #1 patient was evaluated  #2 light debridement of open wound was performed to the periungual border of the respective toe using a currette. Antibiotic ointment and Band-Aid was applied. #3 patient is to return to clinic on a PRN basis.   Felecia Shelling, DPM [facility]  Dr. Felecia Shelling, DPM    [address] [place], [place] [address]                Office [phone]  Fax [phone]

## 2022-07-19 IMAGING — CT CT RENAL STONE PROTOCOL
2 of 4 series · 14 of 46 positions shown, 16 images · non-contrast
Comparison: None.

CLINICAL DATA: Severe right flank and abdominal pain, diaphoretic
and vomiting

EXAM:
CT ABDOMEN AND PELVIS WITHOUT CONTRAST
TECHNIQUE: Multidetector CT imaging of the abdomen and pelvis was performed
following the standard protocol without IV contrast.

[Series 2: stone full standard · axial · 0.87mm/px · z∈[-558,-88]mm · 11 of 109 slices shown, 13 images]
[im 10/109  soft-tissue]
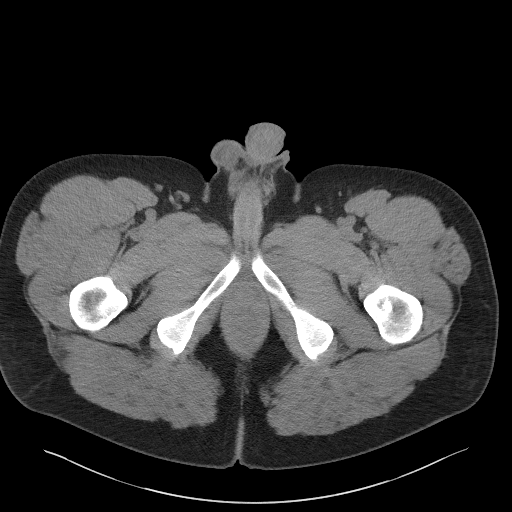
[im 10/109  bone]
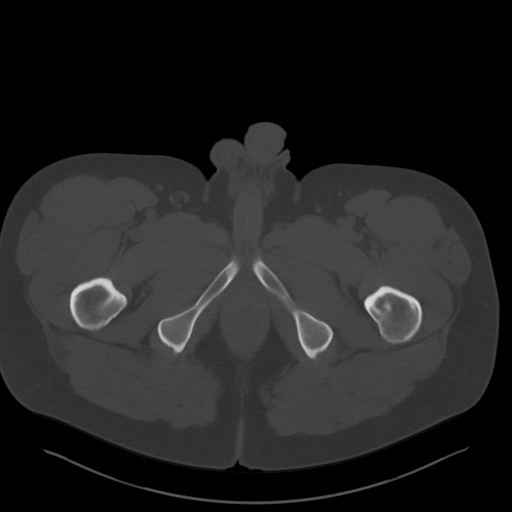
[im 19/109  soft-tissue]
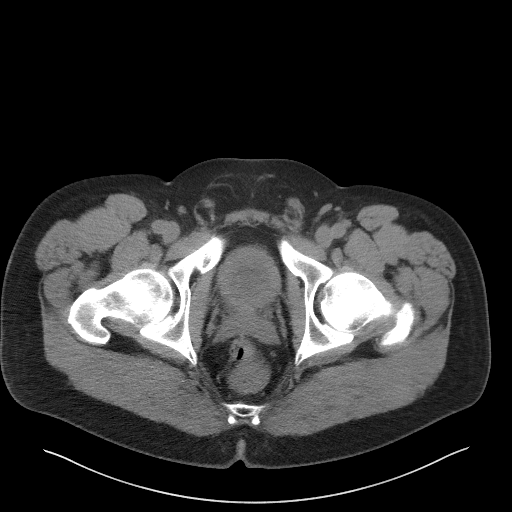
[im 29/109  soft-tissue]
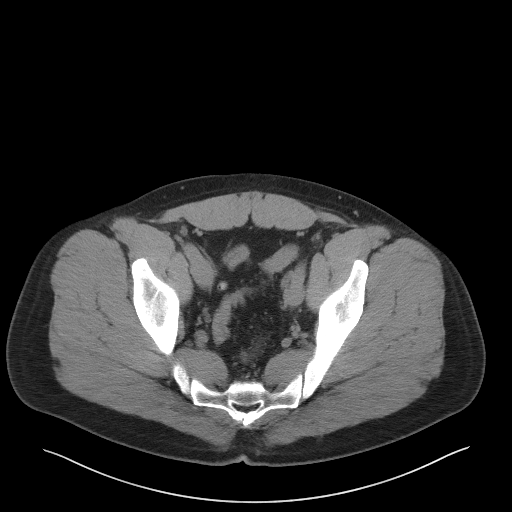
[im 38/109  soft-tissue]
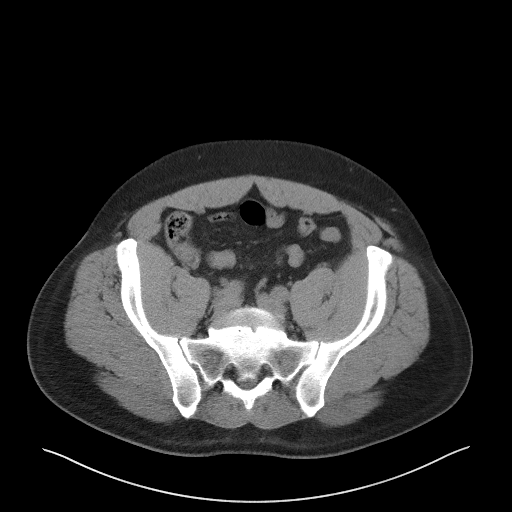
[im 47/109  soft-tissue]
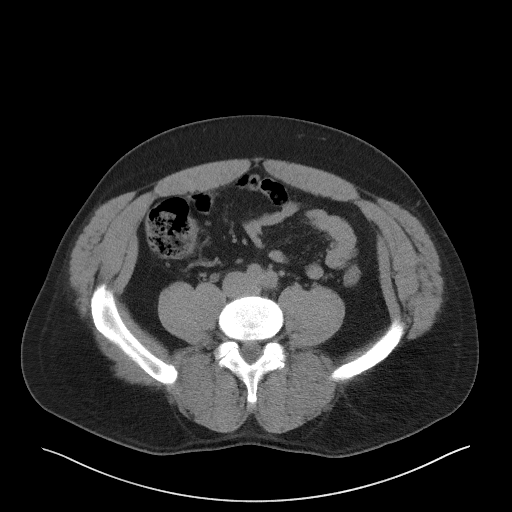
[im 57/109  soft-tissue]
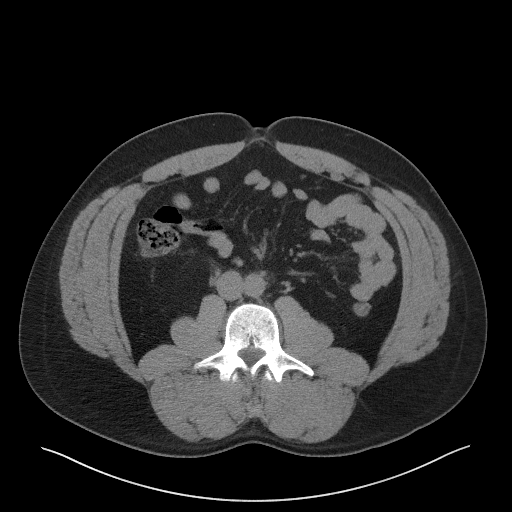
[im 66/109  soft-tissue]
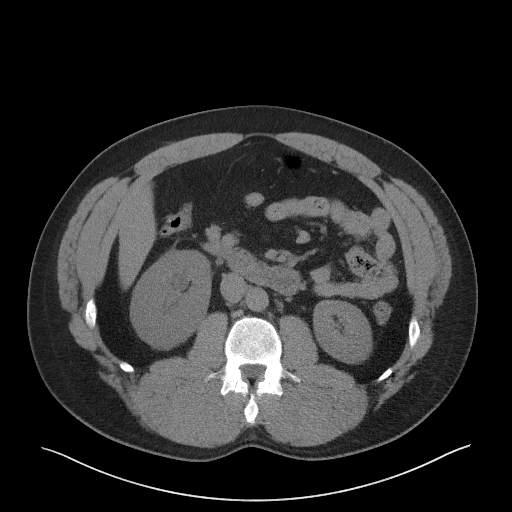
[im 76/109  soft-tissue]
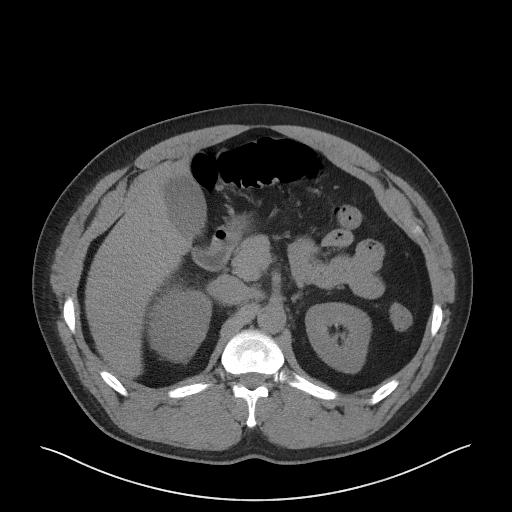
[im 85/109  soft-tissue]
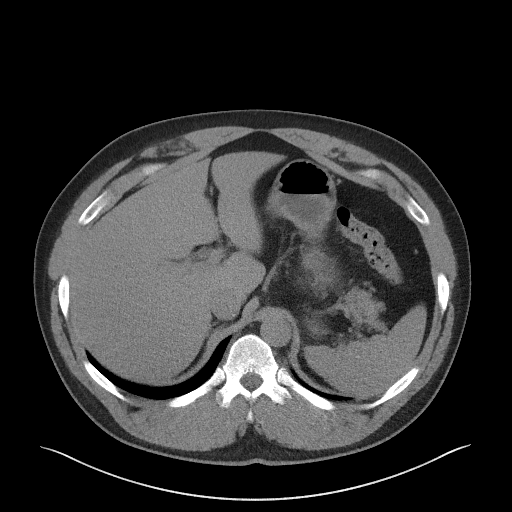
[im 85/109  bone]
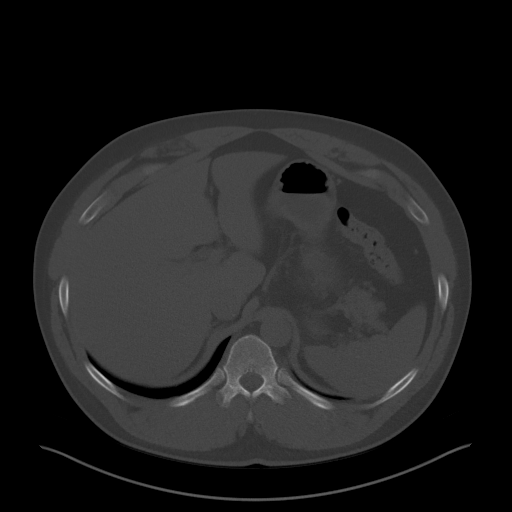
[im 94/109  soft-tissue]
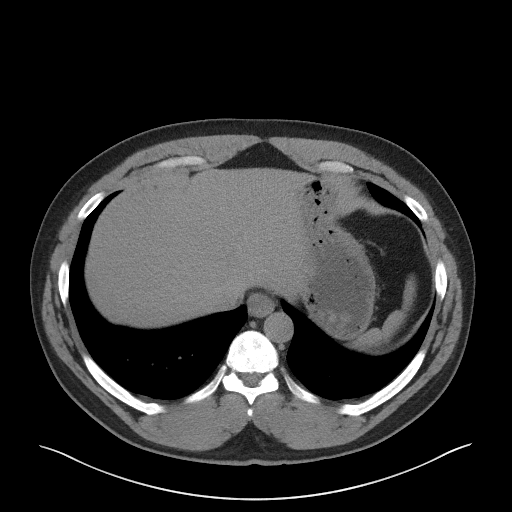
[im 104/109  soft-tissue]
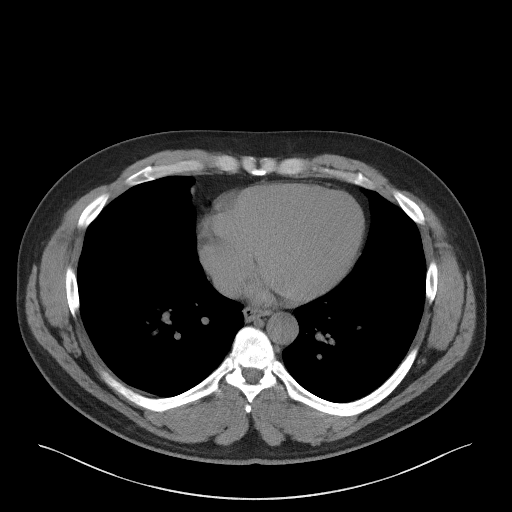

[Series 5: coronal · coronal · 0.82mm/px · 3 of 161 slices shown]
[im 54/161  soft-tissue]
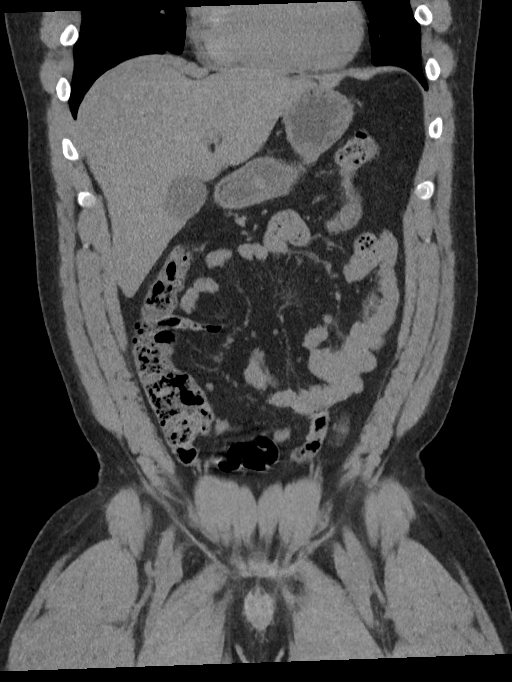
[im 72/161  soft-tissue]
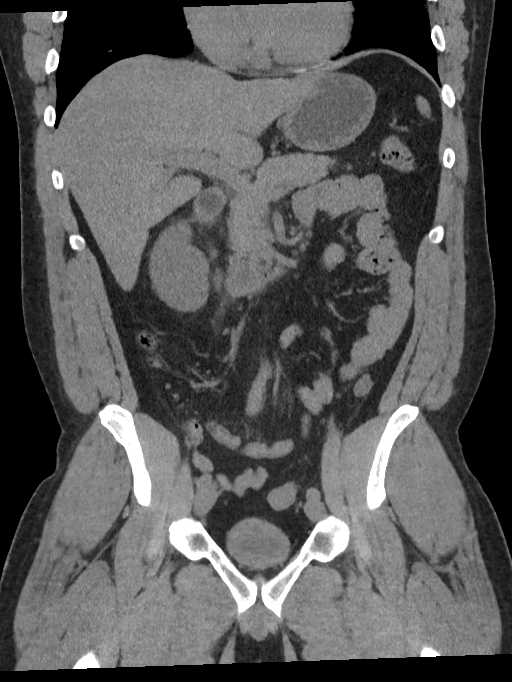
[im 89/161  soft-tissue]
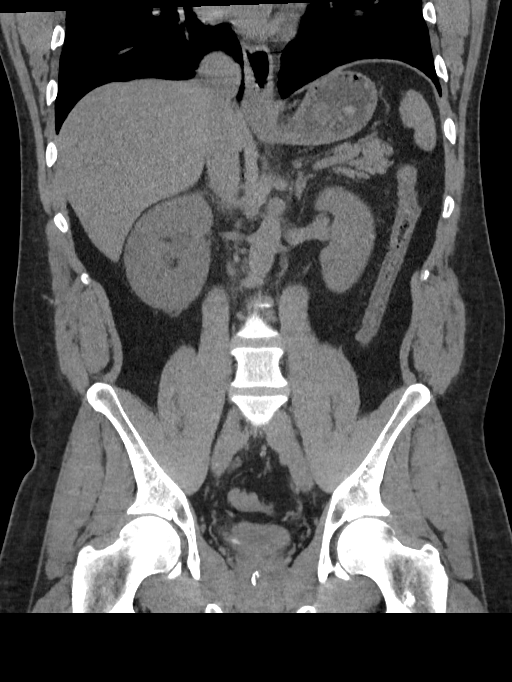

[14 of 46 positions shown; findings below may reference images not displayed]

FINDINGS: Lower chest: Lung bases are clear. Normal heart size. No pericardial
effusion.

Hepatobiliary: No visible Janowska lesions on this unenhanced CT.
Smooth liver surface contour with normal attenuation. Normal
gallbladder and biliary tree without visible calcified gallstone.

Pancreas: Unremarkable. No pancreatic ductal dilatation or
surrounding inflammatory changes.

Spleen: Normal in size. No concerning splenic lesions.

Adrenals/Urinary Tract: Normal adrenal glands. The right kidney
appears asymmetrically enlarged and slightly hypoattenuating
relative to the left kidney likely reflecting some edematous change
with surrounding perinephric stranding and moderate
hydroureteronephrosis to the level of linear hyperdensity in the
right distal ureter which partially protrudes into the right
ureterovesicular junction. This could reflect several stacked
calculi or an elongated calculus measuring up to 5 mm in diameter
though nearly 2 cm in length. Additional nonobstructing calculi
present in the lower pole both kidneys. No focal concerning renal
lesions. Circumferential bladder wall thickening and with
perivesicular hazy stranding is slightly greater than expected for
underdistention and could portend a superimposed cystitis.

Stomach/Bowel: Distal esophagus, stomach and duodenal sweep are
unremarkable. No small bowel wall thickening or dilatation. No
evidence of obstruction. A normal appendix is visualized. No colonic
dilatation or wall thickening accounting for underdistention.

Vascular/Lymphatic: No significant vascular findings are present. No
enlarged abdominal or pelvic lymph nodes.

Reproductive: Coarse eccentric calcification of the prostate. No
concerning abnormalities of the prostate or seminal vesicles.

Other: No abdominopelvic free fluid or free gas. No bowel containing
hernias.

Musculoskeletal: Grade 1 anterolisthesis L5 on S1 with bilateral L5
pars defects. Additional mild multilevel discogenic changes
elsewhere throughout the included thoracolumbar spine.
Sclerotic/lucent lesion noted in the right femoral neck could
reflect an atypical synovial cyst or geode formation. Additional
sclerotic focus in the intertrochanteric region of the left femur,
likely bone island.
IMPRESSION: 1. Moderate right hydroureteronephrosis to the level of linear
hyperdensity in the right distal ureter which partially protrudes
into the right ureterovesicular junction. This could reflect several
stacked calculi or an elongated calculus measuring up to 5 mm in
diameter though nearly 2 cm in length.
2. Circumferential bladder wall thickening and with perivesicular
hazy stranding is slightly greater than expected for underdistention
and could portend a superimposed cystitis. Correlate with
urinalysis.
3. Additional nonobstructing calculi in the lower pole both kidneys.
4. Grade 1 anterolisthesis L5 on S1 with bilateral L5 pars defects.
5. Benign appearing sclerotic/lucent lesion in the right femoral
neck could reflect an atypical synovial cyst or geode formation
given absence of aggressive features.

## 2023-01-06 ENCOUNTER — Ambulatory Visit
Admission: EM | Admit: 2023-01-06 | Discharge: 2023-01-06 | Disposition: A | Discharge status: Home / Self Care | Payer: BLUE CROSS/BLUE SHIELD | Attending: Emergency Medicine | Admitting: Emergency Medicine

## 2023-01-06 ENCOUNTER — Ambulatory Visit: Payer: BLUE CROSS/BLUE SHIELD

## 2023-01-06 DIAGNOSIS — R1032 Left lower quadrant pain: Secondary | ICD-10-CM | POA: Diagnosis present

## 2023-01-06 DIAGNOSIS — M545 Low back pain, unspecified: Secondary | ICD-10-CM | POA: Diagnosis present

## 2023-01-06 DIAGNOSIS — K5900 Constipation, unspecified: Secondary | ICD-10-CM | POA: Diagnosis present

## 2023-01-06 DIAGNOSIS — R3129 Other microscopic hematuria: Secondary | ICD-10-CM | POA: Diagnosis present

## 2023-01-06 LAB — URINALYSIS, W/ REFLEX TO CULTURE (INFECTION SUSPECTED)
Bilirubin Urine: NEGATIVE
Glucose, UA: NEGATIVE mg/dL
Ketones, ur: NEGATIVE mg/dL
Leukocytes,Ua: NEGATIVE
Nitrite: NEGATIVE
Protein, ur: 30 mg/dL — AB
RBC / HPF: >50 RBC/hpf (ref 0–5)
Specific Gravity, Urine: 1.025 (ref 1.005–1.030)
pH: 5.5 (ref 5.0–8.0)

## 2023-01-06 MED ORDER — TAMSULOSIN HCL 0.4 MG PO CAPS: 0.4000 mg | ORAL_CAPSULE | Freq: Every day | ORAL | 0 refills | Status: AC

## 2023-01-06 MED ORDER — LACTULOSE 10 GM/15ML PO SOLN: 10.0000 g | Freq: Every day | ORAL | 0 refills | Status: AC | PRN

## 2023-01-06 NOTE — ED Provider Notes (Signed)
[facility]    CSN: [id number] Arrival date & time: 01/06/23  1706      History   Chief Complaint Chief Complaint  Patient presents with   Back Pain   Abdominal Pain    HPI Brandon Wyatt is a 37 y.o. male.   Patient reports intermittent achy/sharp left lower abdominal pain x 3 days with constipation, reduced appetite, and fatigue. States he has only had "small rabbit turds come out." Reports occasional lower back spasms and cramps, chills, and dark urine. Denies fever, urinary frequency, or dysuria. Abdominal pain is 2-3/10 at this time. States pain is worse at night and gets better with moving around. Has history of spondylosis at L5. He says he has taken Norco 2 days ago (leftover from previous kidney surgery). No other concerns.  HPI  Past Medical History:  Diagnosis Date   Depression    Family history of adverse reaction to anesthesia    grandfatehr was hallucinating, possible due to medication and due to age    History of kidney stones    Hypertension     There are no problems to display for this patient.   Past Surgical History:  Procedure Laterality Date   CYSTOSCOPY/URETEROSCOPY/HOLMIUM LASER/STENT PLACEMENT Right 11/19/2019   Procedure: CYSTOSCOPY/URETEROSCOPY/HOLMIUM LASER/STENT PLACEMENT;  Surgeon: Sondra Come, MD;  Location: [facility];  Service: Urology;  Laterality: Right;   KNEE SURGERY Left        Home Medications    Prior to Admission medications   Medication Sig Start Date End Date Taking? Authorizing Provider  tamsulosin (FLOMAX) 0.4 MG CAPS capsule Take 1 capsule (0.4 mg total) by mouth daily. 01/06/23 02/05/23 Yes Eusebio Friendly B, PA-C  amLODipine (NORVASC) 10 MG tablet Take 10 mg by mouth daily. 10/02/20   [provider]  doxycycline (VIBRA-TABS) 100 MG tablet Take 1 tablet (100 mg total) by mouth 2 (two) times daily. 11/28/20   Felecia Shelling, DPM  gentamicin cream (GARAMYCIN) 0.1 % Apply 1 application topically 2  (two) times daily. 11/28/20   Felecia Shelling, DPM  losartan (COZAAR) 100 MG tablet Take 100 mg by mouth daily. 09/03/19   [provider]  rosuvastatin (CRESTOR) 10 MG tablet Take 10 mg by mouth at bedtime. 09/16/19   [provider]    Family History History reviewed. No pertinent family history.  Social History Social History   Tobacco Use   Smoking status: Never   Smokeless tobacco: Never  Vaping Use   Vaping status: Never Used  Substance Use Topics   Alcohol use: Yes    Comment: mainly weekends   Drug use: Not Currently    Types: Marijuana     Allergies   Patient has no known allergies.   Review of Systems Review of Systems  Constitutional:  Positive for appetite change and chills. Negative for fatigue and fever.  Respiratory:  Negative for shortness of breath.   Cardiovascular:  Negative for chest pain.  Gastrointestinal:  Positive for abdominal pain, constipation and nausea. Negative for diarrhea and vomiting.  Genitourinary:  Positive for flank pain. Negative for difficulty urinating, dysuria, frequency and hematuria.  Musculoskeletal:  Positive for back pain.  Neurological:  Negative for dizziness, weakness and headaches.     Physical Exam Triage Vital Signs ED Triage Vitals  Encounter Vitals Group     BP 01/06/23 1732 (!) 166/123     Systolic BP Percentile --      Diastolic BP Percentile --  Pulse Rate 01/06/23 1732 (!) 117     Resp --      Temp 01/06/23 1732 98.6 F (37 C)     Temp Source 01/06/23 1732 Oral     SpO2 01/06/23 1732 99 %     Weight 01/06/23 1730 280 lb (127 kg)     Height 01/06/23 1730 6' (1.829 m)     Head Circumference --      Peak Flow --      Pain Score 01/06/23 1730 3     Pain Loc --      Pain Education --      Exclude from Growth Chart --    No data found.  Updated Vital Signs BP (!) 179/116 (BP Location: Left Arm)   Pulse (!) 117   Temp 98.6 F (37 C) (Oral)   Ht 6' (1.829 m)   Wt 280 lb (127 kg)    SpO2 99%   BMI 37.97 kg/m   Physical Exam Vitals and nursing note reviewed.  Constitutional:      General: He is not in acute distress.    Appearance: Normal appearance. He is well-developed. He is not ill-appearing.  HENT:     Head: Normocephalic and atraumatic.     Nose: Nose normal.     Mouth/Throat:     Mouth: Mucous membranes are moist.     Pharynx: Oropharynx is clear.  Eyes:     General: No scleral icterus.    Conjunctiva/sclera: Conjunctivae normal.  Cardiovascular:     Rate and Rhythm: Regular rhythm. Tachycardia present.  Pulmonary:     Effort: Pulmonary effort is normal. No respiratory distress.     Breath sounds: Normal breath sounds.  Abdominal:     General: Abdomen is protuberant. Bowel sounds are normal.     Palpations: Abdomen is soft.     Tenderness: There is abdominal tenderness (LLQ). There is left CVA tenderness. There is no right CVA tenderness, guarding or rebound.  Musculoskeletal:     Cervical back: Neck supple.  Skin:    General: Skin is warm and dry.     Capillary Refill: Capillary refill takes less than 2 seconds.  Neurological:     General: No focal deficit present.     Mental Status: He is alert. Mental status is at baseline.     Motor: No weakness.     Gait: Gait normal.  Psychiatric:        Mood and Affect: Mood normal.        Behavior: Behavior normal.      UC Treatments / Results  Labs (all labs ordered are listed, but only abnormal results are displayed) Labs Reviewed  URINALYSIS, W/ REFLEX TO CULTURE (INFECTION SUSPECTED) - Abnormal; Notable for the following components:      Result Value   APPearance HAZY (*)    Hgb urine dipstick LARGE (*)    Protein, ur 30 (*)    Bacteria, UA FEW (*)    All other components within normal limits    EKG   Radiology No results found.  CLINICAL DATA:  Left lower quadrant and left flank pain with microscopic hematuria and constipation.   EXAM: ABDOMEN - 1 VIEW   COMPARISON:  None  Available.   FINDINGS: The bowel gas pattern is normal. A large amount of stool is seen within the ascending colon and proximal transverse colon. No radio-opaque calculi or other significant radiographic abnormality are seen.   IMPRESSION: Large stool burden without evidence  of bowel obstruction.     Electronically Signed   By: Aram Candela M.D.   On: 01/06/2023 19:45  Procedures Procedures (including critical care time)  Medications Ordered in UC Medications - No data to display  Initial Impression / Assessment and Plan / UC Course  I have reviewed the triage vital signs and the nursing notes.  Pertinent labs & imaging results that were available during my care of the patient were reviewed by me and considered in my medical decision making (see chart for details).   37 year old male presents for 3-day history of left lower abdominal pain that is intermittent, achy and sharp.  He has been constipated, had reduced appetite, nausea, fatigue, chills, back pain and dark urine.  Patient says abdominal pain is mild at this time.  It is worse at night.  History of kidney stones.  Patient is afebrile.  Pulse elevated at 117 bpm.  Blood pressure 166/123.  He is not ill-appearing.  On exam abdomen is protuberant/distended.  Tenderness left left lower quadrant and left CVA tenderness.  Chest clear.  Urinalysis shows 1.025 specific gravity, large hemoglobin, hazy appearance, protein.  Ordered KUB to assess for stone, constipation, potential bowel obstruction.   X-ray shows large stool burden but no definite stones. No bowel obstruction.  Reviewed imaging results with patient.  Advised treating him for constipation which is likely causing him the issues.  He is agreeable.  I advised him to try senna and MiraLAX over-the-counter first.  If that is not working may take the lactulose as prescribed.  He additionally would still like to be treated for kidney stones.  I explained to him that I  do not think this is causing his issue but he is insistent.  Sent Flomax to pharmacy.  Advised if his symptoms go away he should discontinue taking this medication.  Patient is agreeable.  Condition is acute illness which could represent bodily harm.   Final Clinical Impressions(s) / UC Diagnoses   Final diagnoses:  Constipation, unspecified constipation type  Abdominal pain, left lower quadrant  Acute bilateral low back pain without sciatica  Microscopic hematuria     Discharge Instructions      -You are very constipated.  I sent something to the pharmacy for constipation but if you do not feel comfortable taking that you can try senna and MiraLAX.  Increase rest and fluids. - I sent tamsulosin to the pharmacy for possible kidney stone.  I cannot rule this out.  However if you have a bowel movement and your symptoms go away you do not need to take the Flomax. - If you need something for sleep tonight take melatonin and Benadryl. - If your symptoms worsen please go to the ER.  If you are unable to have bowel movement in a couple of days or you develop fever, go to ER.     ED Prescriptions     Medication Sig Dispense Auth. Provider   tamsulosin (FLOMAX) 0.4 MG CAPS capsule Take 1 capsule (0.4 mg total) by mouth daily. 30 capsule Eusebio Friendly B, PA-C   lactulose (CHRONULAC) 10 GM/15ML solution Take 15 mLs (10 g total) by mouth daily as needed for up to 3 days for mild constipation or moderate constipation. 50 mL Shirlee Latch, PA-C      PDMP not reviewed this encounter.   Shirlee Latch, PA-C 01/10/23 [phone]

## 2023-01-06 NOTE — ED Triage Notes (Signed)
PT c/o left side abdomen and lower back pain x4days  Pt states that the main issue is in the lower left abdomen. Pt has had a loss of appetite, trouble with bowel movements, and trouble sleeping due to pain.   PT states that he is having blood in urine and dark colored urine.   Pt has a history of kidney stones and had to have one removed 3 years ago. Pt states that the pain feels different.

## 2023-01-06 NOTE — Discharge Instructions (Signed)
-  You are very constipated.  I sent something to the pharmacy for constipation but if you do not feel comfortable taking that you can try senna and MiraLAX.  Increase rest and fluids. - I sent tamsulosin to the pharmacy for possible kidney stone.  I cannot rule this out.  However if you have a bowel movement and your symptoms go away you do not need to take the Flomax. - If you need something for sleep tonight take melatonin and Benadryl. - If your symptoms worsen please go to the ER.  If you are unable to have bowel movement in a couple of days or you develop fever, go to ER.

## 2023-01-12 ENCOUNTER — Encounter: Payer: Self-pay | Admitting: Urology

## 2023-01-12 ENCOUNTER — Ambulatory Visit: Payer: BLUE CROSS/BLUE SHIELD | Admitting: Urology

## 2023-01-12 VITALS — BP 146/102 | HR 116 | Ht 72.0 in | Wt 277.0 lb

## 2023-01-12 DIAGNOSIS — R1032 Left lower quadrant pain: Secondary | ICD-10-CM

## 2023-01-12 DIAGNOSIS — R3121 Asymptomatic microscopic hematuria: Secondary | ICD-10-CM

## 2023-01-12 DIAGNOSIS — Z87442 Personal history of urinary calculi: Secondary | ICD-10-CM | POA: Diagnosis not present

## 2023-01-12 DIAGNOSIS — N200 Calculus of kidney: Secondary | ICD-10-CM

## 2023-01-12 MED ORDER — KETOROLAC TROMETHAMINE 10 MG PO TABS: 10.0000 mg | ORAL_TABLET | Freq: Four times a day (QID) | ORAL | 0 refills | Status: AC | PRN

## 2023-01-12 MED ORDER — TRAMADOL HCL 50 MG PO TABS: 50.0000 mg | ORAL_TABLET | Freq: Four times a day (QID) | ORAL | 0 refills | Status: AC | PRN

## 2023-01-12 NOTE — Progress Notes (Signed)
01/12/23 10:44 AM   Barbette Reichmann 05-May-1985 [id number]  CC: Left flank pain, history of nephrolithiasis  HPI: 37 year old male who I saw previously in 2021 for uric acid stones that required ureteroscopy on the right side.  He reports 1 week of left-sided flank pain that feels similar to prior kidney stone, x-ray was done and KUB and not surprisingly was benign with his history of uric acid stones that would not show up on x-ray.  Urinalysis that time showed microscopic hematuria, no evidence of infection, high suspicion for left ureteral stone.  He denies any fevers or chills.  He has taken Aleve the last 2 nights.  He has been taking Flomax.   PMH: Past Medical History:  Diagnosis Date   Depression    Family history of adverse reaction to anesthesia    grandfatehr was hallucinating, possible due to medication and due to age    History of kidney stones    Hypertension     Surgical History: Past Surgical History:  Procedure Laterality Date   CYSTOSCOPY/URETEROSCOPY/HOLMIUM LASER/STENT PLACEMENT Right 11/19/2019   Procedure: CYSTOSCOPY/URETEROSCOPY/HOLMIUM LASER/STENT PLACEMENT;  Surgeon: Sondra Come, MD;  Location: [facility];  Service: Urology;  Laterality: Right;   KNEE SURGERY Left      Social History:  reports that he has never smoked. He has never been exposed to tobacco smoke. He has never used smokeless tobacco. He reports current alcohol use. He reports that he does not currently use drugs after having used the following drugs: Marijuana.  Physical Exam: BP (!) 146/102   Pulse (!) 116   Ht 6' (1.829 m)   Wt 277 lb (125.6 kg)   BMI 37.57 kg/m    Constitutional:  Alert and oriented, No acute distress. Cardiovascular: No clubbing, cyanosis, or edema. Respiratory: Normal respiratory effort, no increased work of breathing. GI: Abdomen is soft, nontender, nondistended, no abdominal masses   Laboratory Data: Reviewed, see HPI  Pertinent Imaging: I have  personally viewed and interpreted the KUB, no evidence of stone disease, though patient has history of uric acid stones that would not show up on KUB.  Assessment & Plan:   37 year old male with history of uric acid stones, left-sided flank pain, microscopic hematuria, high suspicion for left ureteral stone.  We discussed various treatment options for urolithiasis including observation with or without medical expulsive therapy, shockwave lithotripsy (SWL), ureteroscopy and laser lithotripsy with stent placement, and percutaneous nephrolithotomy.We discussed that management is based on stone size, location, density, patient co-morbidities, and patient preference. Stones <54mm in size have a >80% spontaneous passage rate. Data surrounding the use of tamsulosin for medical expulsive therapy is controversial, but meta analyses suggests it is most efficacious for distal stones between 5-93mm in size. Possible side effects include dizziness/lightheadedness, and retrograde ejaculation.SWL has a lower stone free rate in a single procedure, but also a lower complication rate compared to ureteroscopy and avoids a stent and associated stent related symptoms. Possible complications include renal hematoma, steinstrasse, and need for additional treatment. Not a candidate for shockwave based on his history of uric acid stones.  Ureteroscopy with laser lithotripsy and stent placement has a higher stone free rate than SWL in a single procedure, however increased complication rate including possible infection, ureteral injury, bleeding, and stent related morbidity. Common stent related symptoms include dysuria, urgency/frequency, and flank pain.  Stat CT today, call with results.  If large stone anticipate left ureteroscopy, laser lithotripsy   Legrand Rams, MD 01/12/2023  [facility]  [address] [place], [place] [address] [phone]

## 2023-01-25 ENCOUNTER — Other Ambulatory Visit: Payer: Self-pay

## 2023-01-25 DIAGNOSIS — R1032 Left lower quadrant pain: Secondary | ICD-10-CM

## 2023-02-01 ENCOUNTER — Ambulatory Visit: Payer: Self-pay | Admitting: Urology
